# Patient Record
Sex: Female | Born: 1995
Health system: Southern US, Community
[De-identification: ages and names within clinical notes are randomized; demographics above are authoritative.]

## PROBLEM LIST (undated history)

## (undated) ENCOUNTER — Inpatient Hospital Stay: Payer: Self-pay

## (undated) DIAGNOSIS — J45909 Unspecified asthma, uncomplicated: Secondary | ICD-10-CM

## (undated) DIAGNOSIS — D649 Anemia, unspecified: Secondary | ICD-10-CM

## (undated) DIAGNOSIS — Z789 Other specified health status: Secondary | ICD-10-CM

## (undated) HISTORY — PX: CHOLECYSTECTOMY: SHX55

---
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
## 2015-01-12 NOTE — H&P (Signed)
PATIENT NAME:  Jamie [name], Jamie [name] MR#:  [id number] DATE OF BIRTH:  1996/08/19  DATE OF ADMISSION:  04/24/2014  CHIEF COMPLAINT: Right upper quadrant pain.   HISTORY OF PRESENT ILLNESS: This is [name] patient with 6 weeks of abdominal pain. She states it has been constant, has not changed, is not affected by foods. Points to the right upper quadrant as the area of maximal tenderness. She states that the reason she came in today was because she started having intractable nausea and vomiting. Thinks that she has had fevers, has not taken her temperature.  Has never had an episode prior to late June, but states that her pain has been nearly constant and never went away completely. Denies jaundice or acholic stools.   PAST MEDICAL HISTORY: None.   PAST SURGICAL HISTORY: None.   ALLERGIES: NONE.   MEDICATIONS: None.   FAMILY HISTORY: Noncontributory.   SOCIAL HISTORY: The patient is [place] in [place]. Does not smoke nor does she drink.   REVIEW OF SYSTEMS: Ten-system review is performed and negative with the exception of that mentioned in the HPI.   PHYSICAL EXAMINATION:  GENERAL: Uncomfortable -appearing thin female patient, BMI of 22.  She prefers to keep her eyes closed and, in fact, does not open her eyes at all during the entire interview or physical exam.  VITAL SIGNS: Stable. She is afebrile.  HEENT: No scleral icterus is noted when she does open her eyes for the exam.  NECK: No palpable neck nodes.  CHEST: Clear to auscultation.  CARDIAC: Regular rate and rhythm.  ABDOMEN: Soft. There is tenderness in the right upper quadrant with [name] positive Murphy's sign.  EXTREMITIES: Without edema.  NEUROLOGIC: Grossly intact.  INTEGUMENT: No jaundice.   IMAGING:  Ultrasound report is not available, but was conveyed to me as being positive Murphy's sign with [name] stone impacted in the neck of the gallbladder.   LABORATORY DATA:  Liver function tests are within normal limits as are electrolytes.    White blood cell count 17, hemoglobin and hematocrit of 12.8 and 39, platelet count 353,000.   ASSESSMENT AND PLAN: This is [name] patient with acute cholecystitis. Recommend admission to the hospital, starting IV antibiotics, hydrating, controlling pain and nausea, and proceeding with laparoscopic cholecystectomy probably tomorrow. The rationale for this has been discussed. The options of observation have been reviewed. The risks of bleeding, infection, recurrent symptoms, failure to resolve her symptoms, conversion to an open procedure, bile duct damage, bile duct leak, retained common bile duct stone, any of which could require further surgery and/or ERCP, stent, and papillotomy have all been reviewed. She understood and agreed to proceed with this plan.     ____________________________ Adah Salvageichard E. Excell Seltzerooper, MD rec:ts D: 04/24/2014 18:04:10 ET T: 04/24/2014 18:17:42 ET JOB#: [id number]  cc: Adah Salvageichard E. Excell Seltzerooper, MD, <Dictator> Lattie HawICHARD E Aviyon Hocevar MD ELECTRONICALLY SIGNED 04/24/2014 19:00

## 2015-01-12 NOTE — Op Note (Signed)
PATIENT NAME:  Jamie SleightBURCH, Sharica A MR#:  [id number] DATE OF BIRTH:  09-27-1995  DATE OF PROCEDURE:  04/25/2014  PREOPERATIVE DIAGNOSIS: Acute cholecystitis.   POSTOPERATIVE DIAGNOSIS: Acute cholecystitis.   PROCEDURE: Laparoscopic cholecystectomy.   SURGEON: Dionne Miloichard Danon Lograsso, MD.   ANESTHESIA: General with endotracheal tube.  ASSISTANT: Anselm LisStephanie Meiners, PA   INDICATIONS: This is a patient with unrelenting right upper quadrant pain associated with a workup showing gallstones and a gallstone impacted in the neck of the gallbladder. Preoperatively, we discussed rationale for surgery, the options of observation, risks of bleeding, infection, recurrence of symptoms, failure to resolve her symptoms, open procedure, bile duct damage, bile duct leak, retained common bile duct stone, any of which could require further surgery and/or ERCP, stent, and papillotomy. This was reviewed for her in the preoperative holding area. She understood and agreed to proceed.   FINDINGS: Acute edematous cholecystitis, large stone impacted in the infundibulum of the gallbladder, small cystic duct anatomy well identified.   DESCRIPTION OF PROCEDURE: The patient was induced to general anesthesia. She was on IV antibiotics. VTE prophylaxis was in place. She was prepped and draped in a sterile fashion. Marcaine was infiltrated into skin and subcutaneous tissues.   A periumbilical area incision was made. Veress needle was placed. Pneumoperitoneum was obtained. A 5 mm trocar port was placed and the abdominal cavity was explored. Under direct vision, a 10 mm epigastric port and 2 lateral 5 mm ports were placed. The gallbladder was placed on tension. The peritoneum over the infundibulum was incised bluntly. The cystic duct and gallbladder junction was well identified. The cystic artery was well identified, doubly clipped, and divided. This allowed for good visualization of the cystic duct as it entered the infundibulum of the  gallbladder. Here it was doubly clipped and divided, and then the gallbladder was taken from the gallbladder fossa with electrocautery. Two branches of a lateral arterial branch were encountered and these were doubly clipped and divided as well. One clip was dropped, and an attempt at retrieving it was not fruitful. The gallbladder was removed from the gallbladder fossa with electrocautery, and passed out through the epigastric port site with the aid of an Endo Catch bag. The area was checked for hemostasis and found to be adequate. The area was irrigated with copious amounts of normal saline. Again, hemostasis was adequate. There was no sign of bile leak or bleeding. The camera was placed in the epigastric site to view back to the periumbilical site. There was no sign of bowel injury. Therefore, pneumoperitoneum was released. All ports were removed. Fascial edges at the epigastric site were approximated with figure-of-eight 0 Vicryls; 4-0 subcuticular Monocryl was used on all skin edges. Steri-Strips, Mastisol, and sterile dressings were placed.   The patient tolerated the procedure well. There were no complications. She was taken to the recovery room in stable condition to be admitted for continued care.   ____________________________ Adah Salvageichard E. Excell Seltzerooper, MD rec:nb D: 04/25/2014 10:17:12 ET T: 04/25/2014 11:04:19 ET JOB#: [id number]  cc: Adah Salvageichard E. Excell Seltzerooper, MD, <Dictator> Lattie HawICHARD E Keidy Thurgood MD ELECTRONICALLY SIGNED 04/25/2014 16:12

## 2015-01-12 NOTE — Discharge Summary (Signed)
PATIENT NAME:  Jamie SleightBURCH, Ardell A MR#:  [id number] DATE OF BIRTH:  Mar 17, 1996  DATE OF ADMISSION:  04/24/2014 DATE OF DISCHARGE:  04/27/2014  DIAGNOSES:  1.  Cholelithiasis.  2.  Acute cholecystitis.   PROCEDURES: Laparoscopic cholecystectomy.   HISTORY OF PRESENT ILLNESS AND HOSPITAL COURSE: This is a patient with right upper quadrant pain and a workup showing acute cholecystitis. She was taken to the Operating Room where laparoscopic cholecystectomy was performed. She made an uncomplicated postoperative recovery, and is discharged in stable condition to follow up in our office in 10 days. She is given oral analgesics and is tolerating a regular diet.   Also of note, the patient had a cosmetic injury to her left eye which was brought up while she was in the hospital and is improving. She has no vision problems, per the patient, and states that it is improving. Did not request any additional therapy.   The patient will follow up in our office in 10 days.     ____________________________ Adah Salvageichard E. Excell Seltzerooper, MD rec:at D: 04/27/2014 12:36:42 ET T: 04/27/2014 13:56:38 ET JOB#: [id number]  cc: Adah Salvageichard E. Excell Seltzerooper, MD, <Dictator> Lattie HawICHARD E Harlan Vinal MD ELECTRONICALLY SIGNED 04/27/2014 15:20

## 2015-07-02 ENCOUNTER — Encounter: Payer: Self-pay | Admitting: Emergency Medicine

## 2015-07-02 ENCOUNTER — Emergency Department
Admission: EM | Admit: 2015-07-02 | Discharge: 2015-07-02 | Disposition: A | Discharge status: Home / Self Care | Payer: Medicaid Other | Attending: Emergency Medicine | Admitting: Emergency Medicine

## 2015-07-02 ENCOUNTER — Emergency Department: Payer: Medicaid Other

## 2015-07-02 DIAGNOSIS — O212 Late vomiting of pregnancy: Secondary | ICD-10-CM | POA: Diagnosis not present

## 2015-07-02 DIAGNOSIS — Z3A01 Less than 8 weeks gestation of pregnancy: Secondary | ICD-10-CM | POA: Diagnosis not present

## 2015-07-02 DIAGNOSIS — R109 Unspecified abdominal pain: Secondary | ICD-10-CM | POA: Insufficient documentation

## 2015-07-02 DIAGNOSIS — O3481 Maternal care for other abnormalities of pelvic organs, first trimester: Secondary | ICD-10-CM | POA: Diagnosis not present

## 2015-07-02 DIAGNOSIS — N83201 Unspecified ovarian cyst, right side: Secondary | ICD-10-CM | POA: Diagnosis not present

## 2015-07-02 DIAGNOSIS — O26899 Other specified pregnancy related conditions, unspecified trimester: Secondary | ICD-10-CM

## 2015-07-02 DIAGNOSIS — O9989 Other specified diseases and conditions complicating pregnancy, childbirth and the puerperium: Secondary | ICD-10-CM | POA: Diagnosis not present

## 2015-07-02 DIAGNOSIS — R112 Nausea with vomiting, unspecified: Secondary | ICD-10-CM

## 2015-07-02 LAB — WET PREP, GENITAL
CLUE CELLS WET PREP: NONE SEEN
TRICH WET PREP: NONE SEEN
Yeast Wet Prep HPF POC: NONE SEEN

## 2015-07-02 LAB — COMPREHENSIVE METABOLIC PANEL
ALT: 18 U/L (ref 14–54)
ANION GAP: 5 (ref 5–15)
AST: 18 U/L (ref 15–41)
Albumin: 4.7 g/dL (ref 3.5–5.0)
Alkaline Phosphatase: 52 U/L (ref 38–126)
BILIRUBIN TOTAL: 1.1 mg/dL (ref 0.3–1.2)
BUN: 10 mg/dL (ref 6–20)
CO2: 23 mmol/L (ref 22–32)
Calcium: 9.8 mg/dL (ref 8.9–10.3)
Chloride: 105 mmol/L (ref 101–111)
Creatinine, Ser: 0.63 mg/dL (ref 0.44–1.00)
GFR calc Af Amer: >60 mL/min (ref >60)
Glucose, Bld: 90 mg/dL (ref 65–99)
POTASSIUM: 3.6 mmol/L (ref 3.5–5.1)
Sodium: 133 mmol/L — ABNORMAL LOW (ref 135–145)
TOTAL PROTEIN: 7.6 g/dL (ref 6.5–8.1)

## 2015-07-02 LAB — CBC WITH DIFFERENTIAL/PLATELET
BASOS PCT: 1 %
Basophils Absolute: 0.1 10*3/uL (ref 0–0.1)
EOS ABS: 0 10*3/uL (ref 0–0.7)
Eosinophils Relative: 0 %
HCT: 38.3 % (ref 35.0–47.0)
HEMOGLOBIN: 13 g/dL (ref 12.0–16.0)
Lymphocytes Relative: 13 %
Lymphs Abs: 1.6 10*3/uL (ref 1.0–3.6)
MCH: 29.9 pg (ref 26.0–34.0)
MCHC: 33.9 g/dL (ref 32.0–36.0)
MCV: 88.2 fL (ref 80.0–100.0)
MONO ABS: 0.7 10*3/uL (ref 0.2–0.9)
MONOS PCT: 6 %
NEUTROS PCT: 80 %
Neutro Abs: 10 10*3/uL — ABNORMAL HIGH (ref 1.4–6.5)
Platelets: 385 10*3/uL (ref 150–440)
RBC: 4.34 MIL/uL (ref 3.80–5.20)
RDW: 12.5 % (ref 11.5–14.5)
WBC: 12.3 10*3/uL — ABNORMAL HIGH (ref 3.6–11.0)

## 2015-07-02 LAB — CHLAMYDIA/NGC RT PCR (ARMC ONLY)
CHLAMYDIA TR: NOT DETECTED
N gonorrhoeae: NOT DETECTED

## 2015-07-02 LAB — URINALYSIS COMPLETE WITH MICROSCOPIC (ARMC ONLY)
BILIRUBIN URINE: NEGATIVE
Bacteria, UA: NONE SEEN
GLUCOSE, UA: NEGATIVE mg/dL
Hgb urine dipstick: NEGATIVE
Leukocytes, UA: NEGATIVE
Nitrite: NEGATIVE
Protein, ur: NEGATIVE mg/dL
Specific Gravity, Urine: 1.025 (ref 1.005–1.030)
pH: 6 (ref 5.0–8.0)

## 2015-07-02 LAB — ABO/RH: ABO/RH(D): O POS

## 2015-07-02 LAB — HCG, QUANTITATIVE, PREGNANCY: HCG, BETA CHAIN, QUANT, S: 64893 m[IU]/mL — AB (ref <5)

## 2015-07-02 MED ORDER — METOCLOPRAMIDE HCL 5 MG/ML IJ SOLN
10.0000 mg | Freq: Once | INTRAMUSCULAR | Status: AC
  Administered 2015-07-02: 10 mg via INTRAVENOUS
  Filled 2015-07-02 (×2): qty 2

## 2015-07-02 MED ORDER — METOCLOPRAMIDE HCL 10 MG PO TABS: 10.0000 mg | ORAL_TABLET | Freq: Four times a day (QID) | ORAL | Status: DC | PRN

## 2015-07-02 MED ORDER — VITAMIN B-6 50 MG PO TABS: 75.0000 mg | ORAL_TABLET | Freq: Every day | ORAL | Status: DC

## 2015-07-02 MED ORDER — FAMOTIDINE IN NACL 20-0.9 MG/50ML-% IV SOLN
20.0000 mg | Freq: Once | INTRAVENOUS | Status: AC
  Administered 2015-07-02: 20 mg via INTRAVENOUS
  Filled 2015-07-02: qty 50

## 2015-07-02 MED ORDER — SODIUM CHLORIDE 0.9 % IV BOLUS (SEPSIS)
1000.0000 mL | Freq: Once | INTRAVENOUS | Status: AC
  Administered 2015-07-02: 1000 mL via INTRAVENOUS

## 2015-07-02 NOTE — ED Provider Notes (Signed)
[facility] Emergency Department Provider Note  ____________________________________________  Time seen: Approximately 2:45 PM  I have reviewed the triage vital signs and the nursing notes.   HISTORY  Chief Complaint Emesis During Pregnancy    HPI Jamie [name] is a 19 y.o. female who is a G1 presenting at about 5-6 weeks of pregnancy who is here with vomiting over the past 2 days. She says she is also had some intermittent cramping to the left upper quadrant of her abdomen. She says she does have history of GERD. Denies any vaginal bleeding or discharge at this time. Does have a history of gonorrhea and Chlamydia. Was recently diagnosed with gonorrhea in early October and treated. She has only been seen at the health department thus far for her pregnancy. She is taking prenatal vitamins at home. Denies any abdominal pain at this time. No pain with urination. Says she has vomited everything over the past 2 days. She says she has not even been able to keep down fluids. Denies any blood or green/bilious vomiting. Has not taken any medication for nausea.   History reviewed. No pertinent past medical history.  There are no active problems to display for this patient.   History reviewed. No pertinent past surgical history.  No current outpatient prescriptions on file.  Allergies Review of patient's allergies indicates no known allergies.  No family history on file.  Social History Social History  Substance Use Topics  . Smoking status: Never Smoker   . Smokeless tobacco: None  . Alcohol Use: No    Review of Systems Constitutional: No fever/chills Eyes: No visual changes. ENT: No sore throat. Cardiovascular: Denies chest pain. Respiratory: Denies shortness of breath. Gastrointestinal:  No diarrhea.  No constipation. Genitourinary: Negative for dysuria. Musculoskeletal: Negative for back pain. Skin: Negative for rash. Neurological: Negative for  headaches, focal weakness or numbness.  10-point ROS otherwise negative.  ____________________________________________   PHYSICAL EXAM:  VITAL SIGNS: ED Triage Vitals  Enc Vitals Group     BP 07/02/15 1425 97/50 mmHg     Pulse Rate 07/02/15 1425 95     Resp 07/02/15 1425 20     Temp 07/02/15 1425 97.6 F (36.4 C)     Temp Source 07/02/15 1425 Oral     SpO2 07/02/15 1425 98 %     Weight 07/02/15 1425 114 lb (51.71 kg)     Height 07/02/15 1425  (1.499 m)     Head Cir --      Peak Flow --      Pain Score --      Pain Loc --      Pain Edu? --      Excl. in GC? --     Constitutional: Alert and oriented. Well appearing and in no acute distress. Eyes: Conjunctivae are normal. PERRL. EOMI. Head: Atraumatic. Nose: No congestion/rhinnorhea. Mouth/Throat: Mucous membranes are moist.   Neck: No stridor.   Cardiovascular: Normal rate, regular rhythm. Grossly normal heart sounds.  Good peripheral circulation. Respiratory: Normal respiratory effort.  No retractions. Lungs CTAB. Gastrointestinal: Soft with mild tenderness in the left upper quadrant as well as suprapubic and right lower quadrant. There is no rebound or guarding.. No distention. No abdominal bruits. No CVA tenderness. Genitourinary:  Normal external appearance. Speculum exam without any bleeding or discharge. Bimanual exam with closed cervix. No CMT. No uterine or adnexal masses. No uterine or adnexal tenderness. Musculoskeletal: No lower extremity tenderness nor edema.  No joint effusions.  Neurologic:  Normal speech and language. No gross focal neurologic deficits are appreciated. No gait instability. Skin:  Skin is warm, dry and intact. No rash noted. Psychiatric: Mood and affect are normal. Speech and behavior are normal.  ____________________________________________   LABS (all labs ordered are listed, but only abnormal results are displayed)  Labs Reviewed  WET PREP, GENITAL - Abnormal; Notable for the  following:    WBC, Wet Prep HPF POC FEW (*)    All other components within normal limits  CBC WITH DIFFERENTIAL/PLATELET - Abnormal; Notable for the following:    WBC 12.3 (*)    Neutro Abs 10.0 (*)    All other components within normal limits  HCG, QUANTITATIVE, PREGNANCY - Abnormal; Notable for the following:    hCG, Beta Chain, Quant, [place] [address] (*)    All other components within normal limits  URINALYSIS COMPLETEWITH MICROSCOPIC ([facility] ONLY) - Abnormal; Notable for the following:    Color, Urine YELLOW (*)    APPearance CLEAR (*)    Ketones, ur TRACE (*)    Squamous Epithelial / LPF 0-5 (*)    All other components within normal limits  COMPREHENSIVE METABOLIC PANEL - Abnormal; Notable for the following:    Sodium 133 (*)    All other components within normal limits  CHLAMYDIA/NGC RT PCR ([facility] ONLY)  ABO/RH   ____________________________________________  EKG   ____________________________________________  RADIOLOGY  Ultrasound with single IUP with estimated age of 6 weeks and 1 day. A small subchorionic hemorrhage. Also with 1.9 x 2.9 x 2.3 simple appearing right ovarian cyst. Heart rate of 114. ____________________________________________   PROCEDURES   ____________________________________________   INITIAL IMPRESSION / ASSESSMENT AND PLAN / ED COURSE  Pertinent labs & imaging results that were available during my care of the patient were reviewed by me and considered in my medical decision making (see chart for details).  ----------------------------------------- 5:41 PM on 07/02/2015 -----------------------------------------  Patient has not vomited throughout her visit after Reglan. Also says pain is completely gone. I reexamined her abdomen which is soft and completely nontender. I focused specifically on the right lower quadrant and periumbilical area with there was no pain to both moderate deep palpation. She says the pain has been migrating  throughout her abdomen. We discussed her elevated white count as well as very detailed return precautions. I counseled the patient extensively about right lower quadrant pain being a possible sign of appendicitis. She knows to return immediately for any further pain or nausea or vomiting. Although the patient does have an elevated white count at this time. Her abdomen has been soft without any guarding throughout her stay. The pain is migrating throughout the abdomen and is not staying localized throughout the past 1-2 days. Additionally, she has a right-sided ovarian cyst which could be contributing to the pain. Furthermore, the patient is now saying that since she works at [employer] there are some strong smells at her job which is been bothering her since she has been pregnant and causing her nausea and vomiting. She will continue to take her prenatal vitamins. I'll prescribe her vitamin B6 for further nausea control as well as Reglan for breakthrough nausea or vomiting. She plans to follow-up with the [facility] clinic. She understands the plan and is willing to comply. ____________________________________________   FINAL CLINICAL IMPRESSION(S) / ED DIAGNOSES  Acute abdominal pain with nausea and vomiting in pregnancy.    Myrna Blazer, MD 07/02/15 [phone]

## 2015-07-02 NOTE — ED Notes (Signed)
States she is about 5 weeks preg and has had vomiting since yesterday .Marland Kitchen Denies any vaginal cramping or bleeding

## 2015-07-02 NOTE — ED Notes (Signed)
Pt with nausea, some emesis. Denies cough, fever. Lungs clear bilaterally, + bowel sounds. States 5-7 weeks preg. First preg.

## 2015-07-08 ENCOUNTER — Encounter: Payer: Self-pay | Admitting: Emergency Medicine

## 2015-07-08 ENCOUNTER — Emergency Department
Admission: EM | Admit: 2015-07-08 | Discharge: 2015-07-08 | Discharge status: Against Medical Advice | Payer: Medicaid Other | Attending: Emergency Medicine | Admitting: Emergency Medicine

## 2015-07-08 DIAGNOSIS — O210 Mild hyperemesis gravidarum: Secondary | ICD-10-CM | POA: Diagnosis not present

## 2015-07-08 DIAGNOSIS — O9989 Other specified diseases and conditions complicating pregnancy, childbirth and the puerperium: Secondary | ICD-10-CM | POA: Insufficient documentation

## 2015-07-08 DIAGNOSIS — Z3A01 Less than 8 weeks gestation of pregnancy: Secondary | ICD-10-CM | POA: Diagnosis not present

## 2015-07-08 DIAGNOSIS — R1030 Lower abdominal pain, unspecified: Secondary | ICD-10-CM | POA: Insufficient documentation

## 2015-07-08 LAB — COMPREHENSIVE METABOLIC PANEL
ALBUMIN: 3.9 g/dL (ref 3.5–5.0)
ALK PHOS: 48 U/L (ref 38–126)
ALT: 14 U/L (ref 14–54)
AST: 17 U/L (ref 15–41)
Anion gap: 5 (ref 5–15)
BILIRUBIN TOTAL: 0.3 mg/dL (ref 0.3–1.2)
BUN: 7 mg/dL (ref 6–20)
CALCIUM: 8.8 mg/dL — AB (ref 8.9–10.3)
CO2: 28 mmol/L (ref 22–32)
Chloride: 107 mmol/L (ref 101–111)
Creatinine, Ser: 0.53 mg/dL (ref 0.44–1.00)
GFR calc Af Amer: >60 mL/min (ref >60)
GFR calc non Af Amer: >60 mL/min (ref >60)
GLUCOSE: 100 mg/dL — AB (ref 65–99)
Potassium: 3.5 mmol/L (ref 3.5–5.1)
Sodium: 140 mmol/L (ref 135–145)
TOTAL PROTEIN: 6.8 g/dL (ref 6.5–8.1)

## 2015-07-08 LAB — CBC WITH DIFFERENTIAL/PLATELET
BASOS ABS: 0.2 10*3/uL — AB (ref 0–0.1)
BASOS PCT: 1 %
EOS ABS: 0.1 10*3/uL (ref 0–0.7)
EOS PCT: 1 %
HCT: 37.2 % (ref 35.0–47.0)
Hemoglobin: 12.5 g/dL (ref 12.0–16.0)
Lymphocytes Relative: 20 %
Lymphs Abs: 2.9 10*3/uL (ref 1.0–3.6)
MCH: 30.2 pg (ref 26.0–34.0)
MCHC: 33.6 g/dL (ref 32.0–36.0)
MCV: 90 fL (ref 80.0–100.0)
MONO ABS: 0.6 10*3/uL (ref 0.2–0.9)
Monocytes Relative: 4 %
Neutro Abs: 10.5 10*3/uL — ABNORMAL HIGH (ref 1.4–6.5)
Neutrophils Relative %: 74 %
PLATELETS: 368 10*3/uL (ref 150–440)
RBC: 4.13 MIL/uL (ref 3.80–5.20)
RDW: 12.6 % (ref 11.5–14.5)
WBC: 14.3 10*3/uL — ABNORMAL HIGH (ref 3.6–11.0)

## 2015-07-08 LAB — URINALYSIS COMPLETE WITH MICROSCOPIC (ARMC ONLY)
BILIRUBIN URINE: NEGATIVE
Bacteria, UA: NONE SEEN
GLUCOSE, UA: NEGATIVE mg/dL
HGB URINE DIPSTICK: NEGATIVE
Ketones, ur: NEGATIVE mg/dL
LEUKOCYTES UA: NEGATIVE
NITRITE: NEGATIVE
Protein, ur: NEGATIVE mg/dL
SPECIFIC GRAVITY, URINE: 1.021 (ref 1.005–1.030)
pH: 6 (ref 5.0–8.0)

## 2015-07-08 LAB — HCG, QUANTITATIVE, PREGNANCY: hCG, Beta Chain, Quant, S: 190965 m[IU]/mL — ABNORMAL HIGH (ref <5)

## 2015-07-08 NOTE — ED Notes (Addendum)
Pt presents to ED with vomiting since 400 this morning with continued nausea and vomiting (X9) since then. Pt now c/o right lower abd pain since around 1800. Movement and touch do no increase pain. Pt currently [redacted] weeks pregnant and was told at last visit that she had a cyst on her right ovary after ultrasound was performed. Imaging was otherwise normal at that time. Denies vaginal bleeding.

## 2015-07-09 ENCOUNTER — Emergency Department
Admission: EM | Admit: 2015-07-09 | Discharge: 2015-07-09 | Discharge status: Against Medical Advice | Payer: Medicaid Other | Attending: Emergency Medicine | Admitting: Emergency Medicine

## 2015-07-09 ENCOUNTER — Telehealth: Payer: Self-pay | Admitting: Emergency Medicine

## 2015-07-09 ENCOUNTER — Encounter: Payer: Self-pay | Admitting: *Deleted

## 2015-07-09 DIAGNOSIS — Z3A01 Less than 8 weeks gestation of pregnancy: Secondary | ICD-10-CM | POA: Insufficient documentation

## 2015-07-09 DIAGNOSIS — Z79899 Other long term (current) drug therapy: Secondary | ICD-10-CM | POA: Insufficient documentation

## 2015-07-09 DIAGNOSIS — O210 Mild hyperemesis gravidarum: Secondary | ICD-10-CM | POA: Diagnosis not present

## 2015-07-09 DIAGNOSIS — R112 Nausea with vomiting, unspecified: Secondary | ICD-10-CM

## 2015-07-09 DIAGNOSIS — R102 Pelvic and perineal pain: Secondary | ICD-10-CM | POA: Diagnosis not present

## 2015-07-09 DIAGNOSIS — O9989 Other specified diseases and conditions complicating pregnancy, childbirth and the puerperium: Secondary | ICD-10-CM | POA: Insufficient documentation

## 2015-07-09 LAB — BASIC METABOLIC PANEL
ANION GAP: 6 (ref 5–15)
BUN: 6 mg/dL (ref 6–20)
CALCIUM: 9.1 mg/dL (ref 8.9–10.3)
CO2: 25 mmol/L (ref 22–32)
Chloride: 108 mmol/L (ref 101–111)
Creatinine, Ser: 0.47 mg/dL (ref 0.44–1.00)
GFR calc Af Amer: >60 mL/min (ref >60)
GFR calc non Af Amer: >60 mL/min (ref >60)
GLUCOSE: 86 mg/dL (ref 65–99)
POTASSIUM: 3.7 mmol/L (ref 3.5–5.1)
Sodium: 139 mmol/L (ref 135–145)

## 2015-07-09 LAB — URINALYSIS COMPLETE WITH MICROSCOPIC (ARMC ONLY)
BILIRUBIN URINE: NEGATIVE
Bacteria, UA: NONE SEEN
Glucose, UA: NEGATIVE mg/dL
Hgb urine dipstick: NEGATIVE
LEUKOCYTES UA: NEGATIVE
NITRITE: NEGATIVE
PH: 7 (ref 5.0–8.0)
Protein, ur: NEGATIVE mg/dL
SPECIFIC GRAVITY, URINE: 1.009 (ref 1.005–1.030)

## 2015-07-09 MED ORDER — SODIUM CHLORIDE 0.9 % IV BOLUS (SEPSIS)
1000.0000 mL | Freq: Once | INTRAVENOUS | Status: AC
  Administered 2015-07-09: 1000 mL via INTRAVENOUS

## 2015-07-09 MED ORDER — METOCLOPRAMIDE HCL 5 MG/ML IJ SOLN
10.0000 mg | Freq: Once | INTRAMUSCULAR | Status: AC
  Administered 2015-07-09: 10 mg via INTRAVENOUS
  Filled 2015-07-09 (×2): qty 2

## 2015-07-09 NOTE — ED Notes (Signed)
Pt states she has to leave, related ride.

## 2015-07-09 NOTE — ED Notes (Addendum)
Pt reports vomiting since yesterday morning, has vomited multiple times today, c/o headache. Denies vaginal d/c, bleeding, or urinary symptoms. Pt states she is approx [redacted] weeks pregnant. Pt was here last night and LWBS prior to evaluation.

## 2015-07-09 NOTE — ED Provider Notes (Signed)
[facility] Emergency Department Provider Note   ____________________________________________  Time seen: 2000  I have reviewed the triage vital signs and the nursing notes.   HISTORY  Chief Complaint Emesis During Pregnancy and Abdominal Pain   History limited by: Not Limited   HPI Jamie [name] is a 19 y.o. female at roughly [redacted] weeks pregnant who presents to the emergency department today because of concerns for nausea and vomiting. She has been seen previously in the emergency department for the same symptoms. She states that her nausea and vomiting has continued. It is worse in the morning time. She has only sporadically taken the anti-medic that was prescribed to her at her previous emergency department visit. She denies taking in the past couple of days. In addition the patient states she has had some continued right pelvic pain.  History reviewed. No pertinent past medical history.  There are no active problems to display for this patient.   Past Surgical History  Procedure Laterality Date  . Cholecystectomy      Current Outpatient Rx  Name  Route  Sig  Dispense  Refill  . metoCLOPramide (REGLAN) 10 MG tablet   Oral   Take 1 tablet (10 mg total) by mouth every 6 (six) hours as needed (breakthrough nausea/vomiting).   12 tablet   0   . pyridOXINE (VITAMIN B-6) 50 MG tablet   Oral   Take 1.5 tablets (75 mg total) by mouth daily.   45 tablet   0     Allergies Review of patient's allergies indicates no known allergies.  No family history on file.  Social History Social History  Substance Use Topics  . Smoking status: Never Smoker   . Smokeless tobacco: None  . Alcohol Use: No    Review of Systems  Constitutional: Negative for fever. Cardiovascular: Negative for chest pain. Respiratory: Negative for shortness of breath. Gastrointestinal: Right pelvic pain, nausea and vomiting. Genitourinary: Negative for  dysuria. Musculoskeletal: Negative for back pain. Skin: Negative for rash. Neurological: Negative for headaches, focal weakness or numbness.   10-point ROS otherwise negative.  ____________________________________________   PHYSICAL EXAM:  VITAL SIGNS: ED Triage Vitals  Enc Vitals Group     BP 07/09/15 1850 122/63 mmHg     Pulse Rate 07/09/15 1850 95     Resp 07/09/15 1850 16     Temp 07/09/15 1850 99.6 F (37.6 C)     Temp Source 07/09/15 1850 Oral     SpO2 07/09/15 1850 95 %     Weight 07/09/15 1850 115 lb (52.164 kg)     Height 07/09/15 1850 4\' 11"  (1.499 m)     Head Cir --      Peak Flow --      Pain Score 07/09/15 1851 9   Constitutional: Alert and oriented. Well appearing and in no distress. Eyes: Conjunctivae are normal. PERRL. Normal extraocular movements. ENT   Head: Normocephalic and atraumatic.   Nose: No congestion/rhinnorhea.   Mouth/Throat: Mucous membranes are moist.   Neck: No stridor. Hematological/Lymphatic/Immunilogical: No cervical lymphadenopathy. Cardiovascular: Normal rate, regular rhythm.  No murmurs, rubs, or gallops. Respiratory: Normal respiratory effort without tachypnea nor retractions. Breath sounds are clear and equal bilaterally. No wheezes/rales/rhonchi. Gastrointestinal: Soft and nontender. No distention.  Genitourinary: Deferred Musculoskeletal: Normal range of motion in all extremities. No joint effusions.  No lower extremity tenderness nor edema. Neurologic:  Normal speech and language. No gross focal neurologic deficits are appreciated. Speech is normal.  Skin:  Skin is warm, dry and intact. No rash noted. Psychiatric: Mood and affect are normal. Speech and behavior are normal. Patient exhibits appropriate insight and judgment.  ____________________________________________    LABS (pertinent positives/negatives)  Labs Reviewed  URINALYSIS COMPLETEWITH MICROSCOPIC ([facility] ONLY) - Abnormal; Notable for the following:     Color, Urine YELLOW (*)    APPearance CLEAR (*)    Ketones, ur TRACE (*)    Squamous Epithelial / LPF 0-5 (*)    All other components within normal limits  BASIC METABOLIC PANEL     ____________________________________________   EKG  None  ____________________________________________    RADIOLOGY  None   ____________________________________________   PROCEDURES  Procedure(s) performed: None  Critical Care performed: No  ____________________________________________   INITIAL IMPRESSION / ASSESSMENT AND PLAN / ED COURSE  Pertinent labs & imaging results that were available during my care of the patient were reviewed by me and considered in my medical decision making (see chart for details).   Patient presents to the emergency department with continued nausea and vomiting in the setting of pregnancy. On exam patient in no acute distress. Will plan on getting blood work and given IV fluids.  Patient left the emergency department prior to completion of fluids. Blood work did not show any concerning findings. ____________________________________________   FINAL CLINICAL IMPRESSION(S) / ED DIAGNOSES  Final diagnoses:  Non-intractable vomiting with nausea, vomiting of unspecified type     Phineas Semen, MD 07/09/15 2118

## 2015-07-09 NOTE — ED Notes (Addendum)
Pt signed ama states can not wait related to ride. Alert x4 ambulates without distress.

## 2015-07-09 NOTE — ED Notes (Signed)
Called patient due to lwot to inquire about condition and follow up plans. Left message with my number. 

## 2015-09-19 LAB — OB RESULTS CONSOLE RUBELLA ANTIBODY, IGM: RUBELLA: IMMUNE

## 2015-09-19 LAB — OB RESULTS CONSOLE HEPATITIS B SURFACE ANTIGEN: Hepatitis B Surface Ag: NEGATIVE

## 2015-09-19 LAB — OB RESULTS CONSOLE VARICELLA ZOSTER ANTIBODY, IGG: Varicella: IMMUNE

## 2015-09-22 NOTE — L&D Delivery Note (Signed)
Delivery Note At 10:54 PM a viable female was delivered via C-Section, Low Transverse (Presentation: cephalic).  APGAR: 8, 9; weight 6 lb 5.9 oz (2890 g).   Placenta status: Intact, .  Cord: 3 vessels with the following complications: none apparent.  Cord pH: n/a  Anesthesia: Spinal  Episiotomy: n/a  Lacerations:  n/a  Est. Blood Loss (mL):  400cc  Mom to postpartum.  Baby to Couplet care / Skin to Skin.  See op note for details of surgery.  Mom presented for IOL due to past due dates at 40.5, received cervidil, pitocin and AROM.  She held at 4cm for some time and began to have stronger contractions.  She then refused further intervention of epidural or pitocin after pit rest and requested cesarean delivery.  It was explained to her tirelessly that she was not yet in labor and perhaps more time and intervention would allow for labor and a vaginal delivery.  She refused.  Cesarean delivery was uncomplicated, and we sang happy birthday to baby Jamie [name].  Chelsea C Ward 03/02/2016, 12:10 AM

## 2015-11-18 ENCOUNTER — Observation Stay
Admission: EM | Admit: 2015-11-18 | Discharge: 2015-11-18 | Disposition: A | Discharge status: Home / Self Care | Payer: Medicaid Other | Attending: Obstetrics and Gynecology | Admitting: Obstetrics and Gynecology

## 2015-11-18 ENCOUNTER — Encounter: Payer: Self-pay | Admitting: *Deleted

## 2015-11-18 DIAGNOSIS — O368190 Decreased fetal movements, unspecified trimester, not applicable or unspecified: Secondary | ICD-10-CM | POA: Diagnosis present

## 2015-11-18 DIAGNOSIS — Z3A26 26 weeks gestation of pregnancy: Secondary | ICD-10-CM | POA: Insufficient documentation

## 2015-11-18 DIAGNOSIS — O368120 Decreased fetal movements, second trimester, not applicable or unspecified: Secondary | ICD-10-CM | POA: Diagnosis not present

## 2015-11-18 LAB — URINALYSIS COMPLETE WITH MICROSCOPIC (ARMC ONLY)
Bilirubin Urine: NEGATIVE
GLUCOSE, UA: NEGATIVE mg/dL
HGB URINE DIPSTICK: NEGATIVE
Ketones, ur: NEGATIVE mg/dL
Nitrite: NEGATIVE
PH: 7 (ref 5.0–8.0)
PROTEIN: NEGATIVE mg/dL
Specific Gravity, Urine: 1.014 (ref 1.005–1.030)

## 2015-11-18 NOTE — OB Triage Note (Signed)
Patient arrived to [facility] with complain of decreased fetal movement since this past Friday evening after working 9  Hours. 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 She states that after work she noticed that the baby movements were decreased and she had tenderness in her abd. In the fundal area. She rates this 3 on upper fundual area to 7 lower abd.  Pt denies bleeding, leaking fluid. She states the last time she felt movement was last night and is marking multiple movements this morning while here.  Ellison Carwin RNC

## 2015-11-18 NOTE — Discharge Summary (Signed)
Obstetric History and Physical  Jamie [name] is a 20 y.o. G1P0 with Estimated Date of Delivery: 02/23/16 per LMP and 6 wk [place] who presents at [redacted]w[redacted]d   presenting for decreased fetal movement since 2/24. Prior to this she noted frequent fetal movement since approx 13 weeks.  Patient states she has been having no contractions, no vaginal bleeding, intact membranes. Reports some upper abdominal/rib discomfort.     Prenatal Course Source of Care: [facility]  with onset of care at 9 weeks Pregnancy complications or risks: Patient Active Problem List   Diagnosis Date Noted  . Decreased fetal movement 11/18/2015     Prenatal Transfer Tool   No past medical history on file.  Past Surgical History  Procedure Laterality Date  . Cholecystectomy      OB History  Gravida Para Term Preterm AB SAB TAB Ectopic Multiple Living  1             # Outcome Date GA Lbr Len/2nd Weight Sex Delivery Anes PTL Lv  1 Current               Social History   Social History  . Marital Status: Single    Spouse Name: N/A  . Number of Children: N/A  . Years of Education: N/A   Social History Main Topics  . Smoking status: Never Smoker   . Smokeless tobacco: Not on file  . Alcohol Use: No  . Drug Use: No  . Sexual Activity: Not on file   Other Topics Concern  . Not on file   Social History Narrative    No family history on file.  Meds: Prenatal Vitamins  No Known Allergies  Review of Systems: Negative except for what is mentioned in HPI.  Physical Exam: BP 111/58 mmHg  Pulse 76  Temp(Src) 98.1 F (36.7 C) (Oral)  Ht  (1.499 m)  Wt 129 lb (58.514 kg)  BMI 26.04 kg/m2  LMP 05/19/2015 GENERAL: Well-developed, well-nourished female in no acute distress.  ABDOMEN: Soft, nontender, nondistended, gravid. EXTREMITIES: Nontender, no edema Cervical Exam:deferred FHT: Category: 1 for gestational age Baseline rate 135 bpm  With moderate variability Contractions: none  Pertinent  Labs/Studies:   Results for orders placed or performed during the hospital encounter of 11/18/15 (from the past 24 hour(s))  Urinalysis complete, with microscopic ([facility] only)     Status: Abnormal   Collection Time: 11/18/15  8:58 AM  Result Value Ref Range   Color, Urine YELLOW (A) YELLOW   APPearance HAZY (A) CLEAR   Glucose, UA NEGATIVE NEGATIVE mg/dL   Bilirubin Urine NEGATIVE NEGATIVE   Ketones, ur NEGATIVE NEGATIVE mg/dL   Specific Gravity, Urine 1.014 1.005 - 1.030   Hgb urine dipstick NEGATIVE NEGATIVE   pH 7.0 5.0 - 8.0   Protein, ur NEGATIVE NEGATIVE mg/dL   Nitrite NEGATIVE NEGATIVE   Leukocytes, UA 2+ (A) NEGATIVE   RBC / HPF 0-5 0 - 5 RBC/hpf   WBC, UA 0-5 0 - 5 WBC/hpf   Bacteria, UA FEW (A) NONE SEEN   Squamous Epithelial / LPF 6-30 (A) NONE SEEN   Mucous PRESENT     Assessment : IUP at [redacted]w[redacted]d, decreased fetal movement  Plan: Urine Culture d/t Leukocytes on UA- will f/u prn  Decreased FM - reviewed normal movement at this gestational age. Pt reassured by reassuring FHR.   Discharge home - f/u as scheduled on 3/9

## 2015-11-20 LAB — URINE CULTURE: Special Requests: NORMAL

## 2015-11-28 LAB — OB RESULTS CONSOLE HIV ANTIBODY (ROUTINE TESTING): HIV: NONREACTIVE

## 2015-11-28 LAB — OB RESULTS CONSOLE RPR: RPR: NONREACTIVE

## 2015-12-07 ENCOUNTER — Encounter: Payer: Self-pay | Admitting: *Deleted

## 2015-12-07 ENCOUNTER — Observation Stay
Admission: EM | Admit: 2015-12-07 | Discharge: 2015-12-07 | Disposition: A | Discharge status: Home / Self Care | Payer: Medicaid Other | Attending: Obstetrics & Gynecology | Admitting: Obstetrics & Gynecology

## 2015-12-07 DIAGNOSIS — Y998 Other external cause status: Secondary | ICD-10-CM | POA: Insufficient documentation

## 2015-12-07 DIAGNOSIS — O9989 Other specified diseases and conditions complicating pregnancy, childbirth and the puerperium: Principal | ICD-10-CM | POA: Insufficient documentation

## 2015-12-07 DIAGNOSIS — Y92410 Unspecified street and highway as the place of occurrence of the external cause: Secondary | ICD-10-CM | POA: Insufficient documentation

## 2015-12-07 DIAGNOSIS — Z3A30 30 weeks gestation of pregnancy: Secondary | ICD-10-CM | POA: Diagnosis not present

## 2015-12-07 DIAGNOSIS — Y9389 Activity, other specified: Secondary | ICD-10-CM | POA: Insufficient documentation

## 2015-12-07 MED ORDER — ACETAMINOPHEN 325 MG PO TABS: 650.0000 mg | ORAL_TABLET | ORAL | Status: DC | PRN

## 2015-12-07 MED ORDER — ONDANSETRON HCL 4 MG/2ML IJ SOLN: 4.0000 mg | Freq: Four times a day (QID) | INTRAMUSCULAR | Status: DC | PRN
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
## 2015-12-07 NOTE — Discharge Instructions (Signed)
Call your provider for any questions or concerns
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
## 2016-01-24 LAB — OB RESULTS CONSOLE GBS: GBS: POSITIVE

## 2016-02-17 ENCOUNTER — Observation Stay
Admission: EM | Admit: 2016-02-17 | Discharge: 2016-02-17 | Disposition: A | Discharge status: Home / Self Care | Payer: Medicaid Other | Attending: Obstetrics & Gynecology | Admitting: Obstetrics & Gynecology

## 2016-02-17 DIAGNOSIS — Z3A39 39 weeks gestation of pregnancy: Secondary | ICD-10-CM | POA: Insufficient documentation

## 2016-02-17 DIAGNOSIS — O471 False labor at or after 37 completed weeks of gestation: Principal | ICD-10-CM | POA: Insufficient documentation

## 2016-02-17 HISTORY — DX: Other specified health status: Z78.9

## 2016-02-17 NOTE — OB Triage Note (Signed)
Jamie [name], G1P0 came in with increase contractions today at 6139 and 1 weeks. Patient states "I am having contractions about every 17 minutes. I am having pain with my contractions and making it hard to breath". Patient denies vaginal discharge and reports positive FM.

## 2016-02-18 NOTE — Discharge Summary (Signed)
Patient presented for evaluation of labor.  Patient had cervical exam by RN and this was reported to me. I reviewed her vital signs and fetal tracing, both of which were reassuring.  Patient was discharged as she was not laboring.  NST interpretation: Reactive.  Sajad Glander, MD Attending Obstetrician and Gynecologist [facility]   

## 2016-02-28 ENCOUNTER — Inpatient Hospital Stay
Admit: 2016-02-28 | Discharge: 2016-03-04 | DRG: 765 | Disposition: A | Discharge status: Home / Self Care | Payer: Medicaid Other | Attending: Certified Nurse Midwife | Admitting: Certified Nurse Midwife

## 2016-02-28 DIAGNOSIS — Z3A41 41 weeks gestation of pregnancy: Secondary | ICD-10-CM | POA: Diagnosis not present

## 2016-02-28 DIAGNOSIS — O480 Post-term pregnancy: Secondary | ICD-10-CM | POA: Diagnosis present

## 2016-02-28 DIAGNOSIS — O9902 Anemia complicating childbirth: Secondary | ICD-10-CM | POA: Diagnosis present

## 2016-02-28 DIAGNOSIS — Z22330 Carrier of Group B streptococcus: Secondary | ICD-10-CM

## 2016-02-28 DIAGNOSIS — O365930 Maternal care for other known or suspected poor fetal growth, third trimester, not applicable or unspecified: Secondary | ICD-10-CM | POA: Diagnosis present

## 2016-02-28 DIAGNOSIS — O864 Pyrexia of unknown origin following delivery: Secondary | ICD-10-CM | POA: Diagnosis present

## 2016-02-28 DIAGNOSIS — D62 Acute posthemorrhagic anemia: Secondary | ICD-10-CM | POA: Diagnosis present

## 2016-02-28 LAB — CBC
HCT: 33.1 % — ABNORMAL LOW (ref 35.0–47.0)
Hemoglobin: 11.2 g/dL — ABNORMAL LOW (ref 12.0–16.0)
MCH: 28.2 pg (ref 26.0–34.0)
MCHC: 33.8 g/dL (ref 32.0–36.0)
MCV: 83.3 fL (ref 80.0–100.0)
PLATELETS: 338 10*3/uL (ref 150–440)
RBC: 3.97 MIL/uL (ref 3.80–5.20)
RDW: 14.2 % (ref 11.5–14.5)
WBC: 12.1 10*3/uL — AB (ref 3.6–11.0)

## 2016-02-28 LAB — TYPE AND SCREEN
ABO/RH(D): O POS
Antibody Screen: NEGATIVE

## 2016-02-28 MED ORDER — OXYTOCIN 40 UNITS IN LACTATED RINGERS INFUSION - SIMPLE MED
2.5000 [IU]/h | INTRAVENOUS | Status: DC
  Administered 2016-03-01: 500 mL via INTRAVENOUS

## 2016-02-28 MED ORDER — OXYTOCIN BOLUS FROM INFUSION: 500.0000 mL | INTRAVENOUS | Status: DC

## 2016-02-28 MED ORDER — ACETAMINOPHEN 325 MG PO TABS: 650.0000 mg | ORAL_TABLET | ORAL | Status: DC | PRN

## 2016-02-28 MED ORDER — ZOLPIDEM TARTRATE 5 MG PO TABS: 5.0000 mg | ORAL_TABLET | Freq: Every evening | ORAL | Status: DC | PRN

## 2016-02-28 MED ORDER — DINOPROSTONE 10 MG VA INST
10.0000 mg | VAGINAL_INSERT | Freq: Once | VAGINAL | Status: AC
  Administered 2016-02-28: 10 mg via VAGINAL
  Filled 2016-02-28: qty 1

## 2016-02-28 MED ORDER — LACTATED RINGERS IV SOLN
INTRAVENOUS | Status: DC
  Administered 2016-02-28: 21:00:00 via INTRAVENOUS
  Administered 2016-02-29: 125 mL/h via INTRAVENOUS
  Administered 2016-03-01: 07:00:00 via INTRAVENOUS

## 2016-02-28 MED ORDER — AMPICILLIN SODIUM 2 G IJ SOLR
2.0000 g | Freq: Once | INTRAMUSCULAR | Status: AC
  Administered 2016-02-29: 2 g via INTRAVENOUS
  Filled 2016-02-28: qty 2000

## 2016-02-28 MED ORDER — LACTATED RINGERS IV SOLN: 500.0000 mL | INTRAVENOUS | Status: DC | PRN

## 2016-02-28 MED ORDER — BUTORPHANOL TARTRATE 1 MG/ML IJ SOLN
2.0000 mg | INTRAMUSCULAR | Status: DC | PRN
  Filled 2016-02-28: qty 1

## 2016-02-28 MED ORDER — TERBUTALINE SULFATE 1 MG/ML IJ SOLN: 0.2500 mg | Freq: Once | INTRAMUSCULAR | Status: DC | PRN

## 2016-02-28 NOTE — H&P (Signed)
See paper H&P  Pt admitted for IOL for postdates. Cervix 1 cm, plan for Cervidil overnight.  Pt denies any questions.

## 2016-02-29 LAB — CHLAMYDIA/NGC RT PCR (ARMC ONLY)
CHLAMYDIA TR: NOT DETECTED
N GONORRHOEAE: NOT DETECTED

## 2016-02-29 MED ORDER — TERBUTALINE SULFATE 1 MG/ML IJ SOLN: 0.2500 mg | Freq: Once | INTRAMUSCULAR | Status: DC | PRN

## 2016-02-29 MED ORDER — OXYTOCIN 40 UNITS IN LACTATED RINGERS INFUSION - SIMPLE MED
1.0000 m[IU]/min | INTRAVENOUS | Status: DC
  Administered 2016-02-29: 1 m[IU]/min via INTRAVENOUS
  Administered 2016-03-01: 2 m[IU]/min via INTRAVENOUS
  Filled 2016-02-29: qty 1000

## 2016-02-29 MED ORDER — SODIUM CHLORIDE 0.9 % IV SOLN
1.0000 g | INTRAVENOUS | Status: DC
  Administered 2016-02-29 – 2016-03-02 (×10): 1 g via INTRAVENOUS
  Filled 2016-02-29 (×11): qty 1000

## 2016-02-29 NOTE — Progress Notes (Addendum)
L&D Progress Note  Foley bulb was inserted at 1200 noon has now fallen out and cervix is now 3/50%/-1 to -2 Patient is very comfortable, contractions have spaced out FHR tracing is Cat1 GBS PPX has been started.  A: IOL for postdates in progress  P: Start Pitocin augmentation/induction.  Farrel ConnersGUTIERREZ, Donell Tomkins, CNM

## 2016-02-29 NOTE — Progress Notes (Signed)
L&D Progress Note:  20 year old G1 P0 with EDC=02/23/2016 admitted last night for IOL for postdates. Cervidil inserted last night around 2200.  S: Cervidil fell out while in BR. Feeling contractions. Was resting well this Am.  O: BP 127/85 mmHg  Pulse 94  Temp(Src) 98.6 F (37 C) (Oral)  Resp 20  Ht 5' (1.524 m)  Wt 68.04 kg (150 lb)  BMI 29.30 kg/m2  LMP 05/19/2015  General: appears in NAD, texting on phone FHR: 135 baseline with moderate variability, no decelerations Toco: contractions q2-4 minutes, mild Cervix: 1/50%/-1 to -2 EFW: 6#10oz on Leopold's  A: IUP at 40.6 weeks for IOL FWB: Cat 1 tracing GBS positive: PPX not yet begun  P: Can shower and eat light breakfast Start Ampicillin for GBS PPX Probably will insert foley bulb this AM  Myra Weng, CNM

## 2016-02-29 NOTE — Progress Notes (Signed)
Cervidil fell out while patient using restroom.

## 2016-03-01 ENCOUNTER — Encounter: Payer: Self-pay | Admitting: Anesthesiology

## 2016-03-01 ENCOUNTER — Encounter: Disposition: A | Discharge status: Home / Self Care | Payer: Self-pay | Attending: Certified Nurse Midwife

## 2016-03-01 ENCOUNTER — Inpatient Hospital Stay: Payer: Medicaid Other | Admitting: Anesthesiology

## 2016-03-01 LAB — CBC
HCT: 34.8 % — ABNORMAL LOW (ref 35.0–47.0)
Hemoglobin: 11.5 g/dL — ABNORMAL LOW (ref 12.0–16.0)
MCH: 27.6 pg (ref 26.0–34.0)
MCHC: 32.9 g/dL (ref 32.0–36.0)
MCV: 83.7 fL (ref 80.0–100.0)
PLATELETS: 346 10*3/uL (ref 150–440)
RBC: 4.16 MIL/uL (ref 3.80–5.20)
RDW: 14.2 % (ref 11.5–14.5)
WBC: 19.2 10*3/uL — AB (ref 3.6–11.0)

## 2016-03-01 SURGERY — Surgical Case
Anesthesia: Spinal | Wound class: Clean Contaminated

## 2016-03-01 MED ORDER — DIPHENHYDRAMINE HCL 25 MG PO CAPS: 25.0000 mg | ORAL_CAPSULE | ORAL | Status: DC | PRN

## 2016-03-01 MED ORDER — BUPIVACAINE HCL 0.5 % IJ SOLN
INTRAMUSCULAR | Status: DC | PRN
  Administered 2016-03-01: 10 mL

## 2016-03-01 MED ORDER — NALBUPHINE HCL 10 MG/ML IJ SOLN
5.0000 mg | INTRAMUSCULAR | Status: DC | PRN
  Filled 2016-03-01: qty 0.5

## 2016-03-01 MED ORDER — NALBUPHINE HCL 10 MG/ML IJ SOLN
5.0000 mg | Freq: Once | INTRAMUSCULAR | Status: DC | PRN
  Filled 2016-03-01: qty 0.5

## 2016-03-01 MED ORDER — ONDANSETRON HCL 4 MG/2ML IJ SOLN: 4.0000 mg | Freq: Three times a day (TID) | INTRAMUSCULAR | Status: DC | PRN

## 2016-03-01 MED ORDER — BUPIVACAINE HCL (PF) 0.5 % IJ SOLN
INTRAMUSCULAR | Status: AC
  Filled 2016-03-01: qty 30

## 2016-03-01 MED ORDER — ONDANSETRON HCL 4 MG/2ML IJ SOLN
INTRAMUSCULAR | Status: DC | PRN
  Administered 2016-03-01: 8 mg via INTRAVENOUS

## 2016-03-01 MED ORDER — FENTANYL CITRATE (PF) 100 MCG/2ML IJ SOLN: 25.0000 ug | INTRAMUSCULAR | Status: DC | PRN

## 2016-03-01 MED ORDER — MORPHINE SULFATE (PF) 0.5 MG/ML IJ SOLN
INTRAMUSCULAR | Status: DC | PRN
  Administered 2016-03-01: .1 mg via INTRATHECAL

## 2016-03-01 MED ORDER — NALOXONE HCL 2 MG/2ML IJ SOSY
1.0000 ug/kg/h | PREFILLED_SYRINGE | INTRAVENOUS | Status: DC | PRN
  Filled 2016-03-01: qty 2

## 2016-03-01 MED ORDER — ACETAMINOPHEN 650 MG RE SUPP
650.0000 mg | Freq: Once | RECTAL | Status: DC
  Filled 2016-03-01: qty 1

## 2016-03-01 MED ORDER — BUTORPHANOL TARTRATE 1 MG/ML IJ SOLN
1.0000 mg | INTRAMUSCULAR | Status: DC | PRN
  Administered 2016-03-01 (×5): 1 mg via INTRAVENOUS
  Filled 2016-03-01 (×4): qty 1

## 2016-03-01 MED ORDER — DEXTROSE 5 % IV SOLN
500.0000 mg | Freq: Once | INTRAVENOUS | Status: AC
  Administered 2016-03-01: 500 mg via INTRAVENOUS
  Filled 2016-03-01: qty 500

## 2016-03-01 MED ORDER — BUPIVACAINE HCL (PF) 0.5 % IJ SOLN: 10.0000 mL | Freq: Once | INTRAMUSCULAR | Status: DC

## 2016-03-01 MED ORDER — BUPIVACAINE IN DEXTROSE 0.75-8.25 % IT SOLN
INTRATHECAL | Status: DC | PRN
  Administered 2016-03-01: 1.6 mL via INTRATHECAL

## 2016-03-01 MED ORDER — SODIUM CHLORIDE 0.9% FLUSH: 3.0000 mL | INTRAVENOUS | Status: DC | PRN

## 2016-03-01 MED ORDER — BUPIVACAINE 0.25 % ON-Q PUMP DUAL CATH 400 ML
INJECTION | Status: AC
  Filled 2016-03-01: qty 400

## 2016-03-01 MED ORDER — NALOXONE HCL 0.4 MG/ML IJ SOLN
0.4000 mg | INTRAMUSCULAR | Status: DC | PRN
  Filled 2016-03-01: qty 1

## 2016-03-01 MED ORDER — BUPIVACAINE 0.25 % ON-Q PUMP DUAL CATH 400 ML: 400.0000 mL | INJECTION | Status: DC

## 2016-03-01 MED ORDER — CEFAZOLIN SODIUM-DEXTROSE 2-4 GM/100ML-% IV SOLN: 2.0000 g | INTRAVENOUS | Status: DC

## 2016-03-01 MED ORDER — ACETAMINOPHEN 500 MG PO TABS: 1000.0000 mg | ORAL_TABLET | Freq: Four times a day (QID) | ORAL | Status: DC

## 2016-03-01 MED ORDER — PHENYLEPHRINE HCL 10 MG/ML IJ SOLN
INTRAMUSCULAR | Status: DC | PRN
Start: 2016-03-01 — End: 2016-03-02
  Administered 2016-03-01 (×7): 100 ug via INTRAVENOUS

## 2016-03-01 MED ORDER — SOD CITRATE-CITRIC ACID 500-334 MG/5ML PO SOLN
30.0000 mL | ORAL | Status: AC
  Administered 2016-03-01: 30 mL via ORAL
  Filled 2016-03-01: qty 30

## 2016-03-01 MED ORDER — OXYCODONE HCL 5 MG PO TABS: 5.0000 mg | ORAL_TABLET | Freq: Once | ORAL | Status: DC | PRN

## 2016-03-01 MED ORDER — FENTANYL CITRATE (PF) 100 MCG/2ML IJ SOLN
INTRAMUSCULAR | Status: DC | PRN
  Administered 2016-03-01: 15 ug via INTRATHECAL

## 2016-03-01 MED ORDER — KETOROLAC TROMETHAMINE 30 MG/ML IJ SOLN
30.0000 mg | Freq: Four times a day (QID) | INTRAMUSCULAR | Status: DC | PRN
  Filled 2016-03-01: qty 1

## 2016-03-01 MED ORDER — CEFAZOLIN SODIUM-DEXTROSE 2-4 GM/100ML-% IV SOLN
INTRAVENOUS | Status: AC
  Filled 2016-03-01: qty 100

## 2016-03-01 MED ORDER — OXYCODONE HCL 5 MG/5ML PO SOLN
5.0000 mg | Freq: Once | ORAL | Status: DC | PRN
Start: 2016-03-01 — End: 2016-03-02

## 2016-03-01 MED ORDER — SODIUM CHLORIDE FLUSH 0.9 % IV SOLN
INTRAVENOUS | Status: AC
  Filled 2016-03-01: qty 10

## 2016-03-01 MED ORDER — METHYLERGONOVINE MALEATE 0.2 MG/ML IJ SOLN
INTRAMUSCULAR | Status: DC | PRN
  Administered 2016-03-01: 0.2 mg via INTRAMUSCULAR

## 2016-03-01 MED ORDER — KETOROLAC TROMETHAMINE 30 MG/ML IJ SOLN
30.0000 mg | Freq: Four times a day (QID) | INTRAMUSCULAR | Status: DC | PRN
  Administered 2016-03-02: 30 mg via INTRAVENOUS
  Filled 2016-03-01 (×2): qty 1

## 2016-03-01 MED ORDER — DIPHENHYDRAMINE HCL 50 MG/ML IJ SOLN: 12.5000 mg | INTRAMUSCULAR | Status: DC | PRN

## 2016-03-01 SURGICAL SUPPLY — 30 items
CANISTER SUCT 3000ML (MISCELLANEOUS) ×3 IMPLANT
CATH KIT ON-Q SILVERSOAK 5IN (CATHETERS) ×6 IMPLANT
CLOSURE WOUND 1/2 X4 (GAUZE/BANDAGES/DRESSINGS) ×1
DRSG TELFA 3X8 NADH (GAUZE/BANDAGES/DRESSINGS) ×3 IMPLANT
ELECT CAUTERY BLADE 6.4 (BLADE) ×3 IMPLANT
ELECT REM PT RETURN 9FT ADLT (ELECTROSURGICAL) ×3
ELECTRODE REM PT RTRN 9FT ADLT (ELECTROSURGICAL) ×1 IMPLANT
GAUZE SPONGE 4X4 12PLY STRL (GAUZE/BANDAGES/DRESSINGS) ×3 IMPLANT
GLOVE BIOGEL PI IND STRL 6.5 (GLOVE) ×3 IMPLANT
GLOVE BIOGEL PI INDICATOR 6.5 (GLOVE) ×6
GLOVE SURG SYN 6.5 ES PF (GLOVE) ×12 IMPLANT
GOWN STRL REUS W/ TWL LRG LVL3 (GOWN DISPOSABLE) ×3 IMPLANT
GOWN STRL REUS W/TWL LRG LVL3 (GOWN DISPOSABLE) ×6
LIQUID BAND (GAUZE/BANDAGES/DRESSINGS) ×3 IMPLANT
NS IRRIG 1000ML POUR BTL (IV SOLUTION) ×3 IMPLANT
PACK C SECTION AR (MISCELLANEOUS) ×3 IMPLANT
PAD OB MATERNITY 4.3X12.25 (PERSONAL CARE ITEMS) ×3 IMPLANT
PAD PREP 24X41 OB/GYN DISP (PERSONAL CARE ITEMS) ×3 IMPLANT
STRAP SAFETY BODY (MISCELLANEOUS) ×3 IMPLANT
STRIP CLOSURE SKIN 1/2X4 (GAUZE/BANDAGES/DRESSINGS) ×2 IMPLANT
SUT MNCRL 4-0 (SUTURE) ×2
SUT MNCRL 4-0 27XMFL (SUTURE) ×1
SUT PDS AB 1 TP1 96 (SUTURE) ×3 IMPLANT
SUT VIC AB 0 CT1 36 (SUTURE) ×6 IMPLANT
SUT VIC AB 2-0 CT1 27 (SUTURE) ×2
SUT VIC AB 2-0 CT1 TAPERPNT 27 (SUTURE) ×1 IMPLANT
SUT VIC AB 3-0 SH 27 (SUTURE) ×2
SUT VIC AB 3-0 SH 27X BRD (SUTURE) ×1 IMPLANT
SUTURE MNCRL 4-0 27XMF (SUTURE) ×1 IMPLANT
SWABSTK COMLB BENZOIN TINCTURE (MISCELLANEOUS) ×3 IMPLANT

## 2016-03-01 NOTE — Anesthesia Preprocedure Evaluation (Addendum)
Anesthesia Evaluation  Patient identified by MRN, date of birth, ID band Patient awake    Reviewed: Allergy & Precautions, H&P , NPO status , Patient's Chart, lab work & pertinent test results  History of Anesthesia Complications Negative for: history of anesthetic complications  Airway Mallampati: III  TM Distance: >3 FB Neck ROM: full    Dental  (+) Poor Dentition   Pulmonary neg shortness of breath, asthma ,    Pulmonary exam normal breath sounds clear to auscultation       Cardiovascular Exercise Tolerance: Good negative cardio ROS Normal cardiovascular exam Rhythm:regular Rate:Normal     Neuro/Psych negative neurological ROS  negative psych ROS   GI/Hepatic negative GI ROS, Neg liver ROS,   Endo/Other  negative endocrine ROS  Renal/GU negative Renal ROS  negative genitourinary   Musculoskeletal   Abdominal   Peds  Hematology negative hematology ROS (+)   Anesthesia Other Findings Past Surgical History:   CHOLECYSTECTOMY                                              BMI    Body Mass Index   29.29 kg/m 2      Reproductive/Obstetrics (+) Pregnancy                             Anesthesia Physical Anesthesia Plan  ASA: III  Anesthesia Plan: Spinal   Post-op Pain Management:    Induction:   Airway Management Planned:   Additional Equipment:   Intra-op Plan:   Post-operative Plan:   Informed Consent: I have reviewed the patients History and Physical, chart, labs and discussed the procedure including the risks, benefits and alternatives for the proposed anesthesia with the patient or authorized representative who has indicated his/her understanding and acceptance.   Dental Advisory Given  Plan Discussed with: Anesthesiologist, CRNA and Surgeon  Anesthesia Plan Comments:         Anesthesia Quick Evaluation

## 2016-03-01 NOTE — Progress Notes (Signed)
L&D Progress Note  S: Can I take a shower?  O: 137/79. Afebrile General: using Stadol frequently, getting short term relief of pain FHR 125 with moderate variability Toco: 3 contractions in 10 min, current mvus 155 with Pitocin at 10 miu. The contractions seem to increase shortly after an increase then get less intense. Has been on Pitocin x 18 hours Cervix: no change-still 4 cm.  A:  Possibly the Pitocin receptors are saturated P: Stop Pitocin for 1 hour, then restrat Pitocin Patient may shower  Benedetto Ryder, CNM

## 2016-03-01 NOTE — OR Nursing (Signed)
Time of birth: 2254 Sex: girl Weight: 6 lbs 6oz Apgar: 8   9     Length: 19 1/4inch

## 2016-03-01 NOTE — Progress Notes (Signed)
L&D Progress Note S: "I want a Cesarean section. I can't take this anymore. I don't want to do this. I don't want an epidural. I want to stop the Pitocin and take a shower. This is too much!" "I can't take this pain. " "I want this catheter out -it is hurting my vagina." Did not like nitrous oxide-made her vomit  O: afebrile. 148/83 General: sitting up in bed, venting frustrations with slow progress FHR: 130s with accelerations to 150s and moderate variability, mild variables with contractions and early decelerations IUPC: contractions q2-3 min apart with an occasional run of contractions, Pitocin now at 22 miu/min. MVUs now at 190 Cervix 4.5/90%/-1 (no change over last 1 hour)  A/PPatient wanting to stop induction at this time Highly recommended getting an epidural to cope with the pain, so that we can continue the induction, but she refuses Discussed pain following the cesarean section-having surgery does not stop the pain Unable to reason with patient, who has no support people in the room I have stopped the Pitocin and will let her take a shower if the baby looks fine Dr Elesa MassedWard consulted.  Jamie [name], Jamie [name], CNM

## 2016-03-01 NOTE — Anesthesia Procedure Notes (Addendum)
Spinal Patient location during procedure: OR Start time: 03/01/2016 10:23 PM End time: 03/01/2016 10:27 PM Staffing Anesthesiologist: Katy Fitch K Performed by: anesthesiologist  Preanesthetic Checklist Completed: patient identified, site marked, surgical consent, pre-op evaluation, timeout performed, IV checked, risks and benefits discussed and monitors and equipment checked Spinal Block Patient position: sitting Prep: Betadine Patient monitoring: heart rate, continuous pulse ox, blood pressure and cardiac monitor Approach: midline Location: L3-4 Injection technique: single-shot Needle Needle type: Whitacre and Introducer  Needle gauge: 24 G Needle length: 9 cm Assessment Sensory level: T5. Additional Notes Negative paresthesia. Negative blood return. Positive free-flowing CSF. Expiration date of kit checked and confirmed. Patient tolerated procedure well, without complications.    Date/Time: 03/01/2016 10:39 PM Performed by: Andria Frames Pre-anesthesia Checklist: Patient identified, Emergency Drugs available, Suction available, Patient being monitored and Timeout performed Oxygen Delivery Method: Nasal cannula

## 2016-03-01 NOTE — Progress Notes (Signed)
L&D Progress Note  After shower, patient spoke with significant other, the nurse taking care of her and myself Discussed the pros and cons of continuing the induction vs doing a Cesarean section at this point, when we were just starting to get her into adequate labor. Discussed risks of cesarean section including infection, bleeding, injury to other organs or blood vessels, and the risk of anesthesia. Sheand her SO are wanting to proceed with a Cesarean section and decline further efforts at induction and vaginal delivery.  Farrel ConnersGUTIERREZ, Kemaria Dedic, CNM

## 2016-03-01 NOTE — Progress Notes (Signed)
Ms. Jamie [name] is a Jamie Reichmann20yo G1 who is being induced for past due date. She was admitted on 6/9 and received cervidil, pitocin, AROM, and has not yet progressed past 4cm.  She demanded a cesarean and that efforts to get her into labor be stopped.    Several members of the health care team including myself have attempted to explain the process of induction and the time that it may take, to try to set reasonable expectations.  She does not care to continue this process, is refusing any further interventions.    I am obliged to deliver this baby safely, and due to rupture of membranes with meconium, my responsibility requires me to offer cesarean.  I pleaded with the patient to consider all of the significant risks between the surgery and a vaginal delivery, and she politely declined.    Consents were signed, and we discussed the application of the On-Q pump.  Patient states she understands the risks of open surgery, and confirms that this procedure is entirely elective, and would like to proceed.  Will move to OR when ready for Cesarean Delivery due to maternal refusal of induction.  ----- Ranae Plumberhelsea Naif Alabi, MD Attending Obstetrician and Gynecologist [facility]

## 2016-03-01 NOTE — Progress Notes (Signed)
L&D Progress Note  S: Contractions feeling stronger  O: BP 126/77 mmHg  Pulse 88  Temp(Src) 98.2 F (36.8 C) (Oral)  Resp 17  Ht 5' (1.524 m)  Wt 68.04 kg (150 lb)  BMI 29.30 kg/m2  SpO2 99%  LMP 05/19/2015  General: in NAD, not breathing thru contractions FHR: 135 with accelerations to 150, moderate variability Contractions q2 min apart on 13 miu/min Pitocin AROM for very light green meconium stained amniotic fluid Cervix: 4/70%/-1 IUPC inserted and Pitocin decreased to 6 miu/min  A: IOL in progress FWB: Cat1  P: Titrate Pitocin to 200 mvus Stadol or nitrous oxide or epidural if desires Continue to follow progress and monitor fetal well being  Kallen Mccrystal, CNM

## 2016-03-01 NOTE — Progress Notes (Signed)
L&D Progress Note  S: The contractions started feeling stronger about 2 hours ago  O: BP 110/55 mmHg  Pulse 76  Temp(Src) 98.7 F (37.1 C) (Oral)  Resp 18  Ht 5' (1.524 m)  Wt 68.04 kg (150 lb)  BMI 29.30 kg/m2  SpO2 99%  LMP 05/19/2015  General: in NAD, still texting on phone FHR:150-155 baseline with accelerations to 160s to 170, moderate variability Toco: q2-6 min apart with couplets and triplets on 12 miu/min Pitocin Cervix: 4/70%/-1  A: Some cervical progress FWB: Cat 1 tracing  P: Continue PItocin. Patient declined a rest over night Recheck in 2 hours Declines pain meds/epidural  Kristianne Albin, CNM

## 2016-03-02 ENCOUNTER — Encounter: Payer: Self-pay | Admitting: Obstetrics & Gynecology

## 2016-03-02 LAB — CBC
HCT: 31.1 % — ABNORMAL LOW (ref 35.0–47.0)
Hemoglobin: 10.5 g/dL — ABNORMAL LOW (ref 12.0–16.0)
MCH: 28.4 pg (ref 26.0–34.0)
MCHC: 33.8 g/dL (ref 32.0–36.0)
MCV: 84.1 fL (ref 80.0–100.0)
PLATELETS: 288 10*3/uL (ref 150–440)
RBC: 3.7 MIL/uL — AB (ref 3.80–5.20)
RDW: 14.1 % (ref 11.5–14.5)
WBC: 16.1 10*3/uL — ABNORMAL HIGH (ref 3.6–11.0)

## 2016-03-02 MED ORDER — MEDROXYPROGESTERONE ACETATE 150 MG/ML IM SUSP
150.0000 mg | INTRAMUSCULAR | Status: DC | PRN
Start: 2016-03-02 — End: 2016-03-04

## 2016-03-02 MED ORDER — SODIUM CHLORIDE 0.9% FLUSH: 3.0000 mL | INTRAVENOUS | Status: DC | PRN

## 2016-03-02 MED ORDER — BUPIVACAINE ON-Q PAIN PUMP (FOR ORDER SET NO CHG)
INJECTION | Status: DC
  Filled 2016-03-02: qty 1

## 2016-03-02 MED ORDER — SODIUM CHLORIDE 0.9% FLUSH: 3.0000 mL | Freq: Two times a day (BID) | INTRAVENOUS | Status: DC

## 2016-03-02 MED ORDER — PRENATAL MULTIVITAMIN CH
1.0000 | ORAL_TABLET | Freq: Every day | ORAL | Status: DC
  Administered 2016-03-03: 1 via ORAL
  Filled 2016-03-02: qty 1

## 2016-03-02 MED ORDER — TETANUS-DIPHTH-ACELL PERTUSSIS 5-2.5-18.5 LF-MCG/0.5 IM SUSP: 0.5000 mL | Freq: Once | INTRAMUSCULAR | Status: DC

## 2016-03-02 MED ORDER — SIMETHICONE 80 MG PO CHEW: 160.0000 mg | CHEWABLE_TABLET | Freq: Four times a day (QID) | ORAL | Status: DC | PRN

## 2016-03-02 MED ORDER — COCONUT OIL OIL: 1.0000 "application " | TOPICAL_OIL | Status: DC | PRN

## 2016-03-02 MED ORDER — LACTATED RINGERS IV SOLN: INTRAVENOUS | Status: DC

## 2016-03-02 MED ORDER — OXYTOCIN 40 UNITS IN LACTATED RINGERS INFUSION - SIMPLE MED
2.5000 [IU]/h | INTRAVENOUS | Status: DC
  Administered 2016-03-02: 2.5 [IU]/h via INTRAVENOUS
  Filled 2016-03-02 (×2): qty 1000

## 2016-03-02 MED ORDER — IBUPROFEN 600 MG PO TABS
600.0000 mg | ORAL_TABLET | Freq: Four times a day (QID) | ORAL | Status: DC
  Administered 2016-03-02 – 2016-03-03 (×4): 600 mg via ORAL
  Filled 2016-03-02 (×5): qty 1

## 2016-03-02 MED ORDER — SENNOSIDES-DOCUSATE SODIUM 8.6-50 MG PO TABS
2.0000 | ORAL_TABLET | ORAL | Status: DC
  Filled 2016-03-02: qty 2

## 2016-03-02 MED ORDER — DIBUCAINE 1 % RE OINT: 1.0000 "application " | TOPICAL_OINTMENT | RECTAL | Status: DC | PRN

## 2016-03-02 MED ORDER — OXYCODONE HCL 5 MG PO TABS
5.0000 mg | ORAL_TABLET | ORAL | Status: DC | PRN
Start: 2016-03-02 — End: 2016-03-04

## 2016-03-02 MED ORDER — ACETAMINOPHEN 500 MG PO TABS
1000.0000 mg | ORAL_TABLET | Freq: Four times a day (QID) | ORAL | Status: DC
  Administered 2016-03-02 (×2): 1000 mg via ORAL
  Filled 2016-03-02 (×2): qty 2

## 2016-03-02 MED ORDER — SODIUM CHLORIDE 0.9 % IV SOLN: 250.0000 mL | INTRAVENOUS | Status: DC

## 2016-03-02 MED ORDER — WITCH HAZEL-GLYCERIN EX PADS: 1.0000 "application " | MEDICATED_PAD | CUTANEOUS | Status: DC | PRN

## 2016-03-02 MED ORDER — OXYCODONE HCL 5 MG PO TABS
10.0000 mg | ORAL_TABLET | ORAL | Status: DC | PRN
Start: 2016-03-02 — End: 2016-03-04

## 2016-03-02 MED ORDER — MENTHOL 3 MG MT LOZG
1.0000 | LOZENGE | OROMUCOSAL | Status: DC | PRN
  Filled 2016-03-02: qty 9

## 2016-03-02 NOTE — Progress Notes (Signed)
Admit Date: 02/28/2016 Today's Date: 03/02/2016  Subjective: Postpartum Day 1: Cesarean Delivery Patient reports incisional pain and tolerating PO.    Objective: Vital signs in last 24 hours: Temp:  [97.8 F (36.6 C)-99.3 F (37.4 C)] 98.8 F (37.1 C) (06/12 0713) Pulse Rate:  [69-152] 90 (06/12 0713) Resp:  [18-20] 18 (06/12 0713) BP: (87-148)/(56-94) 116/68 mmHg (06/12 0713) SpO2:  [98 %-100 %] 100 % (06/12 0713)  Physical Exam:  General: alert, cooperative and no distress Lochia: appropriate Uterine Fundus: firm Incision: healing well DVT Evaluation: No evidence of DVT seen on physical exam.   Recent Labs  03/01/16 2038 03/02/16 0341  HGB 11.5* 10.5*  HCT 34.8* 31.1*    Assessment/Plan: Status post Cesarean section. Doing well postoperatively.  Continue current care.  Jamie [name] 03/02/2016, 10:28 AM

## 2016-03-02 NOTE — Plan of Care (Signed)
Problem: Urinary Elimination: Goal: Ability to reestablish a normal urinary elimination pattern will improve Outcome: Not Progressing Has Foley Cath. Post Op

## 2016-03-02 NOTE — Transfer of Care (Signed)
Immediate Anesthesia Transfer of Care Note  Patient: Jamie [name]  Procedure(s) Performed: Procedure(s): CESAREAN SECTION (N/A)  Patient Location: PACU  Anesthesia Type:General  Level of Consciousness: awake, alert  and oriented  Airway & Oxygen Therapy: Patient Spontanous Breathing  Post-op Assessment: Report given to RN and Post -op Vital signs reviewed and stable  Post vital signs: Reviewed and stable  Last Vitals:  Filed Vitals:   03/01/16 2019 03/02/16 0008  BP:    Pulse:    Temp: 37.4 C 36.9 C  Resp: 18     Last Pain:  Filed Vitals:   03/02/16 0008  PainSc: 8          Complications: No apparent anesthesia complications

## 2016-03-02 NOTE — Anesthesia Postprocedure Evaluation (Signed)
Anesthesia Post Note  Patient: Jamie [name]  Procedure(s) Performed: Procedure(s) (LRB): CESAREAN SECTION (N/A)  Patient location during evaluation: Mother Baby Anesthesia Type: Spinal Level of consciousness: awake and alert and oriented Pain management: satisfactory to patient Vital Signs Assessment: post-procedure vital signs reviewed and stable Respiratory status: respiratory function stable Cardiovascular status: stable Postop Assessment: no backache, no headache, spinal receding, patient able to bend at knees, no signs of nausea or vomiting and adequate PO intake Anesthetic complications: no    Last Vitals:  Filed Vitals:   03/02/16 0535 03/02/16 0713  BP: 110/58 116/68  Pulse: 72 90  Temp: 36.9 C 37.1 C  Resp: 18 18    Last Pain:  Filed Vitals:   03/02/16 0713  PainSc: 0-No pain                 Clydene PughBeane, Corie Allis D

## 2016-03-02 NOTE — Anesthesia Post-op Follow-up Note (Signed)
  Anesthesia Pain Follow-up Note  Patient: Jamie [name]  Day #: 1  Date of Follow-up: 03/02/2016 Time: 7:16 AM  Last Vitals:  Filed Vitals:   03/02/16 0535 03/02/16 0713  BP: 110/58 116/68  Pulse: 72 90  Temp: 36.9 C 37.1 C  Resp: 18 18    Level of Consciousness: alert  Pain: mild   Side Effects:None  Catheter Site Exam: site not evaluated  Plan: D/C from anesthesia care  Clydene PughBeane, [name] D

## 2016-03-02 NOTE — Op Note (Signed)
Cesarean Section Procedure Note  02/28/2016 - 03/02/2016  Patient:  Jamie [name]  20 y.o. female Preoperative diagnosis: elective c-section  Postoperative diagnosis: elective c-section, SGA infant, furcate umbilical cord.   PROCEDURE:  Procedure(s): CESAREAN SECTION (N/A) Surgeon:  Surgeon(s) and Role:    * Wenceslaus Gist C Cole Eastridge, MD - Primary    * Farrel Connersolleen Gutierrez, CNM - Assist Anesthesia:  spinal I/O: Total I/O In: 500 [I.V.:500] Out: 500 [Urine:100; Blood:400] Specimens:  Cord Blood Complications: None Apparent Disposition:  VS stable to PACU  Findings:  Live born female with apgars of 8,9, and weight of 6 lb 5.9 oz /2890 g (<10%ile for EGA), normal appearing tubes and ovaries bilaterally, a furcate and thin umbilical cord.    Indication for procedure: 20yo G1P0 @ 41.0 who was undergoing induction of labor due to past due date and refused interventions and requested a cesarean prior to achieving labor.    Procedure Details   The risks, benefits, complications, treatment options, and expected outcomes were discussed with the patient. Informed consent was obtained. The patient was taken to Operating Room, identified as Jamie [name] and the procedure verified as a cesarean delivery.   After administration of anesthesia, the patient was prepped and draped in the usual sterile manner, including a vaginal prep. A surgical time out was performed, with the pediatric team present. After confirming adequate anesthesia, a Pfannenstiel incision was made and carried down through the subcutaneous tissue to the fascia. Fascial incision was made and extended transversely. The fascia was separated from the underlying rectus tissue superiorly and inferiorly. The peritoneum was identified and entered. Peritoneal incision was extended longitudinally.  A low transverse uterine incision was made. Delivered from cephalic presentation was a Live born female. Delayed cord clamping was performed for 60 seconds,  during which time we sang Happy [place] OuchBirthday to baby Darnelle SpangleKaziah. The umbilical cord was doubly clamped and cut, and the baby was handed off to the awaitng neonatal NP.  Cord blood was obtained for evaluation. The placenta was removed intact and was noted to have division of the umbilical cord vessels prior to insertion into the placental disk. The uterus was cleared of clots, membranes, and debris. The uterus, tubes and ovaries appeared normal. The uterine incision was closed with running locking sutures of 0 Vicryl, and then a second, imbricating stitch was placed. Hemostasis was observed. The uterus remained boggy in spite of massage and pitocin, and Methergine 0.2mg  x1 IM was given. The abdominal cavity was evacuated of extraneous fluid. The uterus was returned to the abdominal cavity and again the incision was inspected for hemostasis, which was confirmed.  The paracolic gutters were cleaned. The on-q trochars were pierced through the skin and fascia.  The catheters were threaded through and tucked beneath the fascia.  The fascia was then reapproximated with running suture of vicryl. After a change of gloves, the subcutaneous tissue was irrigated and reapproximated with 3-0 vicryl. The skin was closed with 4-0 Monocryl and covered with surgical glue. The on-q pump was primed with 0.5% bupivacaine and a sterile dressing was applied.   Instrument, sponge, and needle counts were correct prior the abdominal closure and at the conclusion of the case.   I was present and performed this procedure in its entirety.  ----- Ranae Plumberhelsea Brazos Sandoval, MD Attending Obstetrician and Gynecologist [facility]

## 2016-03-02 NOTE — Discharge Instructions (Signed)
Discharge instructions:   Call office if you have any of the following: headache, visual changes, fever >101.0 F, chills, breast concerns, excessive vaginal bleeding, incision drainage or problems, leg pain or redness, depression or any other concerns.   Activity: Do not lift > 10 lbs for 6 weeks.  No intercourse or tampons for 6 weeks.  No driving for 1-2 weeks.   Call your doctor for increased pain or vaginal bleeding, temperature above 101.0, depression, incision issues, or other concerns.  No strenuous activity or heavy lifting for 6 weeks.  No intercourse, tampons, douching, or enemas for 6 weeks.  No tub baths-showers only.  No driving for 2 weeks or while taking pain medications.  Continue prenatal vitamin and iron.  Increase calories and fluids while breastfeeding.  You may have a slight fever when your milk comes in, but it should go away on its own.  If it does not, and rises above 101.0 please call the doctor.  For concerns about your baby, please call your pediatrician For breastfeeding concerns, the lactation consultant can be reached at [phone]
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
## 2016-03-03 LAB — SURGICAL PATHOLOGY

## 2016-03-03 NOTE — Progress Notes (Signed)
POD #2 Primary Cesarean section Subjective:   Tolerating regular diet. Voiding without difficulty. Passing flatus. Had a low grade fever around midnight 100.1. Denies sore throat or URI sx. Baby breastfeeding often  Objective:  Blood pressure 121/74, pulse 78, temperature 98.5 F (36.9 C), temperature source Oral, resp. rate 20, height 5' (1.524 m), weight 68.04 kg (150 lb), last menstrual period 05/19/2015, SpO2 100 %, unknown if currently breastfeeding.  General: NAD Heart: RRR with Grade II/VI systolic murmur at base Pulmonary: no increased work of breathing/ bases CTA Abdomen: non-distended, non-tender, fundus firm at U-2/ ML/ NT Incision: C+D+I. ON Q intact Extremities: no edema, no erythema, no tenderness  Results for orders placed or performed during the hospital encounter of 02/28/16 (from the past 72 hour(s))  CBC     Status: Abnormal   Collection Time: 03/01/16  8:38 PM  Result Value Ref Range   WBC 19.2 (H) 3.6 - 11.0 K/uL   RBC 4.16 3.80 - 5.20 MIL/uL   Hemoglobin 11.5 (L) 12.0 - 16.0 g/dL   HCT 16.134.8 (L) 09.635.0 - 04.547.0 %   MCV 83.7 80.0 - 100.0 fL   MCH 27.6 26.0 - 34.0 pg   MCHC 32.9 32.0 - 36.0 g/dL   RDW 40.914.2 81.111.5 - 91.414.5 %   Platelets 346 150 - 440 K/uL  CBC     Status: Abnormal   Collection Time: 03/02/16  3:41 AM  Result Value Ref Range   WBC 16.1 (H) 3.6 - 11.0 K/uL   RBC 3.70 (L) 3.80 - 5.20 MIL/uL   Hemoglobin 10.5 (L) 12.0 - 16.0 g/dL   HCT 78.231.1 (L) 95.635.0 - 21.347.0 %   MCV 84.1 80.0 - 100.0 fL   MCH 28.4 26.0 - 34.0 pg   MCHC 33.8 32.0 - 36.0 g/dL   RDW 08.614.1 57.811.5 - 46.914.5 %   Platelets 288 150 - 440 K/uL     Assessment:   20 y.o. G1P1001 postoperativeday # 2  Low grade fever 11 hours ago-monitor closely   Plan:  1) Acute blood loss anemia - hemodynamically stable and asymptomatic - po iron and vitamins  2) --/--/O POS (06/09 2134) / Rubella Immune (12/29 0000) / Varicella Immune  3) TDAP UTD  4) Breast  5) Disposition-possible discharge tomorrow  if no further fever  Analiese Krupka, CNM

## 2016-03-04 LAB — RPR, QUANT+TP ABS (REFLEX)
Rapid Plasma Reagin, Quant: 1:1 {titer} — ABNORMAL HIGH
T Pallidum Abs: NEGATIVE

## 2016-03-04 LAB — RPR: RPR Ser Ql: REACTIVE — AB

## 2016-03-04 MED ORDER — MEDROXYPROGESTERONE ACETATE 150 MG/ML IM SUSP
150.0000 mg | Freq: Once | INTRAMUSCULAR | Status: AC
  Administered 2016-03-04: 150 mg via INTRAMUSCULAR
  Filled 2016-03-04: qty 1

## 2016-03-04 MED ORDER — OXYCODONE HCL 5 MG PO TABS: 5.0000 mg | ORAL_TABLET | ORAL | Status: DC | PRN

## 2016-03-04 MED ORDER — MEDROXYPROGESTERONE ACETATE 150 MG/ML IM SUSP: 150.0000 mg | INTRAMUSCULAR | Status: DC

## 2016-03-04 MED ORDER — IBUPROFEN 600 MG PO TABS: 600.0000 mg | ORAL_TABLET | Freq: Four times a day (QID) | ORAL | Status: DC

## 2016-03-04 NOTE — Progress Notes (Signed)
Patient discharged home with infant. Vital signs stable, bleeding within normal limits, uterus firm. Discharge instructions, prescriptions, and follow up appointment given to and reviewed with patient. Patient verbalized understanding, all questions answered. Patient declined wheelchair, ambulated out escorted by father of baby.  Imagene ShellerMegan Calah Gershman, RN

## 2016-06-26 ENCOUNTER — Encounter: Payer: Self-pay | Admitting: Emergency Medicine

## 2016-06-26 DIAGNOSIS — Z5321 Procedure and treatment not carried out due to patient leaving prior to being seen by health care provider: Secondary | ICD-10-CM | POA: Insufficient documentation

## 2016-06-26 DIAGNOSIS — Z4800 Encounter for change or removal of nonsurgical wound dressing: Secondary | ICD-10-CM | POA: Insufficient documentation

## 2016-06-26 NOTE — ED Triage Notes (Signed)
Pt in with co red and swollen are to mid abd states unsure of insect bite.

## 2016-06-27 ENCOUNTER — Emergency Department
Admission: EM | Admit: 2016-06-27 | Discharge: 2016-06-27 | Disposition: A | Discharge status: Home / Self Care | Payer: Medicaid Other | Attending: Emergency Medicine | Admitting: Emergency Medicine

## 2016-06-27 NOTE — ED Notes (Signed)
Called to take to a room and no answer.

## 2016-09-27 ENCOUNTER — Emergency Department
Admission: EM | Admit: 2016-09-27 | Discharge: 2016-09-27 | Disposition: A | Discharge status: Home / Self Care | Payer: No Typology Code available for payment source | Attending: Emergency Medicine | Admitting: Emergency Medicine

## 2016-09-27 DIAGNOSIS — N938 Other specified abnormal uterine and vaginal bleeding: Secondary | ICD-10-CM | POA: Insufficient documentation

## 2016-09-27 DIAGNOSIS — Z79899 Other long term (current) drug therapy: Secondary | ICD-10-CM | POA: Insufficient documentation

## 2016-09-27 DIAGNOSIS — Z791 Long term (current) use of non-steroidal anti-inflammatories (NSAID): Secondary | ICD-10-CM | POA: Insufficient documentation

## 2016-09-27 DIAGNOSIS — N939 Abnormal uterine and vaginal bleeding, unspecified: Secondary | ICD-10-CM | POA: Diagnosis present

## 2016-09-27 LAB — URINALYSIS, COMPLETE (UACMP) WITH MICROSCOPIC
BACTERIA UA: NONE SEEN
BILIRUBIN URINE: NEGATIVE
Glucose, UA: NEGATIVE mg/dL
KETONES UR: NEGATIVE mg/dL
LEUKOCYTES UA: NEGATIVE
Nitrite: NEGATIVE
PH: 7 (ref 5.0–8.0)
Protein, ur: NEGATIVE mg/dL
Specific Gravity, Urine: 1.024 (ref 1.005–1.030)

## 2016-09-27 LAB — POCT PREGNANCY, URINE: Preg Test, Ur: NEGATIVE
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
## 2016-09-27 NOTE — ED Triage Notes (Signed)
Pt reports vaginal bleeding - she had depo shot in June then in October had period but in November and December she had no period (pregnancy test is negative at home) - now she is having pain in RLQ (history of ovarian cyst)

## 2016-12-26 ENCOUNTER — Encounter: Payer: Self-pay | Admitting: Emergency Medicine

## 2016-12-26 ENCOUNTER — Emergency Department
Admission: EM | Admit: 2016-12-26 | Discharge: 2016-12-26 | Disposition: A | Discharge status: Home / Self Care | Payer: Medicaid Other | Attending: Emergency Medicine | Admitting: Emergency Medicine

## 2016-12-26 DIAGNOSIS — R1031 Right lower quadrant pain: Secondary | ICD-10-CM | POA: Insufficient documentation

## 2016-12-26 DIAGNOSIS — Z5321 Procedure and treatment not carried out due to patient leaving prior to being seen by health care provider: Secondary | ICD-10-CM | POA: Insufficient documentation

## 2016-12-26 DIAGNOSIS — Z79899 Other long term (current) drug therapy: Secondary | ICD-10-CM | POA: Insufficient documentation

## 2016-12-26 LAB — COMPREHENSIVE METABOLIC PANEL
ALT: 13 U/L — AB (ref 14–54)
AST: 20 U/L (ref 15–41)
Albumin: 4.8 g/dL (ref 3.5–5.0)
Alkaline Phosphatase: 67 U/L (ref 38–126)
Anion gap: 8 (ref 5–15)
BILIRUBIN TOTAL: 1 mg/dL (ref 0.3–1.2)
BUN: 10 mg/dL (ref 6–20)
CALCIUM: 9.3 mg/dL (ref 8.9–10.3)
CO2: 27 mmol/L (ref 22–32)
CREATININE: 0.54 mg/dL (ref 0.44–1.00)
Chloride: 103 mmol/L (ref 101–111)
GFR calc Af Amer: >60 mL/min (ref >60)
GLUCOSE: 89 mg/dL (ref 65–99)
Potassium: 3.7 mmol/L (ref 3.5–5.1)
Sodium: 138 mmol/L (ref 135–145)
TOTAL PROTEIN: 7.9 g/dL (ref 6.5–8.1)

## 2016-12-26 LAB — URINALYSIS, COMPLETE (UACMP) WITH MICROSCOPIC
Bilirubin Urine: NEGATIVE
GLUCOSE, UA: NEGATIVE mg/dL
HGB URINE DIPSTICK: NEGATIVE
Ketones, ur: 5 mg/dL — AB
Leukocytes, UA: NEGATIVE
NITRITE: NEGATIVE
PH: 8 (ref 5.0–8.0)
Protein, ur: NEGATIVE mg/dL
SPECIFIC GRAVITY, URINE: 1.021 (ref 1.005–1.030)

## 2016-12-26 LAB — CBC
HCT: 38.2 % (ref 35.0–47.0)
Hemoglobin: 13.2 g/dL (ref 12.0–16.0)
MCH: 29.3 pg (ref 26.0–34.0)
MCHC: 34.4 g/dL (ref 32.0–36.0)
MCV: 85.2 fL (ref 80.0–100.0)
PLATELETS: 409 10*3/uL (ref 150–440)
RBC: 4.49 MIL/uL (ref 3.80–5.20)
RDW: 14.1 % (ref 11.5–14.5)
WBC: 8 10*3/uL (ref 3.6–11.0)

## 2016-12-26 LAB — POCT PREGNANCY, URINE: PREG TEST UR: NEGATIVE

## 2016-12-26 LAB — LIPASE, BLOOD: Lipase: 11 U/L (ref 11–51)

## 2016-12-26 NOTE — ED Triage Notes (Signed)
Lower R abdominal pain began yesterday, denies dysuria or pregnancy.

## 2016-12-29 ENCOUNTER — Telehealth: Payer: Self-pay | Admitting: Emergency Medicine

## 2016-12-29 NOTE — Telephone Encounter (Signed)
Called patient due to lwot to inquire about condition and follow up plans. She is doing okay now and is making appt with a pcp.  I told her that if she started having pain again to return here.  Otherwise, she needs to inform the pcp of the lab tests that were done here.

## 2017-03-21 ENCOUNTER — Emergency Department
Admission: EM | Admit: 2017-03-21 | Discharge: 2017-03-21 | Disposition: A | Discharge status: Home / Self Care | Payer: Self-pay | Attending: Emergency Medicine | Admitting: Emergency Medicine

## 2017-03-21 ENCOUNTER — Encounter: Payer: Self-pay | Admitting: Emergency Medicine

## 2017-03-21 ENCOUNTER — Emergency Department: Payer: Self-pay

## 2017-03-21 DIAGNOSIS — N83201 Unspecified ovarian cyst, right side: Secondary | ICD-10-CM

## 2017-03-21 DIAGNOSIS — N83291 Other ovarian cyst, right side: Secondary | ICD-10-CM | POA: Insufficient documentation

## 2017-03-21 LAB — URINALYSIS, COMPLETE (UACMP) WITH MICROSCOPIC
Bilirubin Urine: NEGATIVE
GLUCOSE, UA: NEGATIVE mg/dL
HGB URINE DIPSTICK: NEGATIVE
Ketones, ur: NEGATIVE mg/dL
NITRITE: NEGATIVE
PROTEIN: NEGATIVE mg/dL
Specific Gravity, Urine: 1.013 (ref 1.005–1.030)
pH: 7 (ref 5.0–8.0)

## 2017-03-21 LAB — WET PREP, GENITAL
Clue Cells Wet Prep HPF POC: NONE SEEN
SPERM: NONE SEEN
Trich, Wet Prep: NONE SEEN
YEAST WET PREP: NONE SEEN

## 2017-03-21 LAB — CBC
HCT: 36.9 % (ref 35.0–47.0)
HEMOGLOBIN: 12.6 g/dL (ref 12.0–16.0)
MCH: 29.2 pg (ref 26.0–34.0)
MCHC: 34.2 g/dL (ref 32.0–36.0)
MCV: 85.3 fL (ref 80.0–100.0)
Platelets: 383 10*3/uL (ref 150–440)
RBC: 4.32 MIL/uL (ref 3.80–5.20)
RDW: 14.4 % (ref 11.5–14.5)
WBC: 7.9 10*3/uL (ref 3.6–11.0)

## 2017-03-21 LAB — COMPREHENSIVE METABOLIC PANEL
ALK PHOS: 67 U/L (ref 38–126)
ALT: 25 U/L (ref 14–54)
ANION GAP: 5 (ref 5–15)
AST: 21 U/L (ref 15–41)
Albumin: 4.6 g/dL (ref 3.5–5.0)
BILIRUBIN TOTAL: 1.2 mg/dL (ref 0.3–1.2)
BUN: 13 mg/dL (ref 6–20)
CALCIUM: 9.4 mg/dL (ref 8.9–10.3)
CO2: 27 mmol/L (ref 22–32)
Chloride: 107 mmol/L (ref 101–111)
Creatinine, Ser: 1.03 mg/dL — ABNORMAL HIGH (ref 0.44–1.00)
GFR calc non Af Amer: >60 mL/min (ref >60)
Glucose, Bld: 85 mg/dL (ref 65–99)
Potassium: 4 mmol/L (ref 3.5–5.1)
Sodium: 139 mmol/L (ref 135–145)
TOTAL PROTEIN: 7.5 g/dL (ref 6.5–8.1)

## 2017-03-21 LAB — PREGNANCY, URINE: Preg Test, Ur: NEGATIVE

## 2017-03-21 LAB — CHLAMYDIA/NGC RT PCR (ARMC ONLY)
Chlamydia Tr: NOT DETECTED
N GONORRHOEAE: NOT DETECTED

## 2017-03-21 MED ORDER — IBUPROFEN 600 MG PO TABS
600.0000 mg | ORAL_TABLET | ORAL | Status: AC
  Administered 2017-03-21: 600 mg via ORAL
  Filled 2017-03-21: qty 1

## 2017-03-21 MED ORDER — ONDANSETRON 4 MG PO TBDP: 4.0000 mg | ORAL_TABLET | Freq: Four times a day (QID) | ORAL | 0 refills | Status: AC | PRN

## 2017-03-21 NOTE — Discharge Instructions (Signed)
You were seen in the emergency room for abdominal pain. It is important that you follow up closely with your primary care doctor or OBGYN in the next few days.  Please return to the emergency room right away if you are to develop a fever, severe nausea, your pain becomes severe or worsens, you are unable to keep food down, begin vomiting any dark or bloody fluid, you develop any dark or bloody stools, feel dehydrated, or other new concerns or symptoms arise.

## 2017-03-21 NOTE — ED Triage Notes (Signed)
Pt reports pressure behind her right eye for over 2 weeks; pain daily; denies head injury; pt also adds right lower quadrant pain for the same amount of time; had menstrual cycle for 1 day mid June and that was it, not her normal; denies vaginal discharge; denies urinary s/s; pt in no acute distress;

## 2017-03-21 NOTE — ED Notes (Signed)
Pt ambulatory with steady gait to stat registration inquiring about lab results and treatment room; understands she will be taken to treatment room as soon as available and she will get lab results from provider; waiting patiently;

## 2017-03-21 NOTE — ED Notes (Signed)
Pt awake in lobby, waiting patiently for treatment room
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
## 2017-03-21 NOTE — ED Notes (Signed)
Pt resting in lobby with eyes closed, resp even and unlabored; waiting for treatment room

## 2017-03-22 LAB — URINE CULTURE: Special Requests: NORMAL

## 2017-03-30 ENCOUNTER — Encounter: Payer: Self-pay | Admitting: *Deleted

## 2017-03-30 ENCOUNTER — Emergency Department
Admission: EM | Admit: 2017-03-30 | Discharge: 2017-03-30 | Disposition: A | Discharge status: Home / Self Care | Payer: Self-pay | Attending: Emergency Medicine | Admitting: Emergency Medicine

## 2017-03-30 ENCOUNTER — Emergency Department: Payer: Self-pay

## 2017-03-30 DIAGNOSIS — R1030 Lower abdominal pain, unspecified: Secondary | ICD-10-CM

## 2017-03-30 DIAGNOSIS — R1031 Right lower quadrant pain: Secondary | ICD-10-CM | POA: Insufficient documentation

## 2017-03-30 LAB — COMPREHENSIVE METABOLIC PANEL
ALK PHOS: 75 U/L (ref 38–126)
ALT: 24 U/L (ref 14–54)
ANION GAP: 8 (ref 5–15)
AST: 20 U/L (ref 15–41)
Albumin: 4.9 g/dL (ref 3.5–5.0)
BILIRUBIN TOTAL: 0.9 mg/dL (ref 0.3–1.2)
BUN: 9 mg/dL (ref 6–20)
CO2: 27 mmol/L (ref 22–32)
CREATININE: 0.68 mg/dL (ref 0.44–1.00)
Calcium: 9.9 mg/dL (ref 8.9–10.3)
Chloride: 104 mmol/L (ref 101–111)
GFR calc Af Amer: >60 mL/min (ref >60)
Glucose, Bld: 90 mg/dL (ref 65–99)
Potassium: 3.8 mmol/L (ref 3.5–5.1)
SODIUM: 139 mmol/L (ref 135–145)
TOTAL PROTEIN: 8 g/dL (ref 6.5–8.1)

## 2017-03-30 LAB — CBC
HCT: 40.2 % (ref 35.0–47.0)
HEMOGLOBIN: 13.5 g/dL (ref 12.0–16.0)
MCH: 28.6 pg (ref 26.0–34.0)
MCHC: 33.7 g/dL (ref 32.0–36.0)
MCV: 84.8 fL (ref 80.0–100.0)
Platelets: 417 10*3/uL (ref 150–440)
RBC: 4.74 MIL/uL (ref 3.80–5.20)
RDW: 14.2 % (ref 11.5–14.5)
WBC: 6.7 10*3/uL (ref 3.6–11.0)

## 2017-03-30 LAB — HCG, QUANTITATIVE, PREGNANCY

## 2017-03-30 LAB — LIPASE, BLOOD: LIPASE: 18 U/L (ref 11–51)

## 2017-03-30 MED ORDER — IOPAMIDOL (ISOVUE-300) INJECTION 61%
75.0000 mL | Freq: Once | INTRAVENOUS | Status: AC | PRN
  Administered 2017-03-30: 75 mL via INTRAVENOUS

## 2017-03-30 MED ORDER — IOPAMIDOL (ISOVUE-300) INJECTION 61%
30.0000 mL | Freq: Once | INTRAVENOUS | Status: AC | PRN
  Administered 2017-03-30: 30 mL via ORAL

## 2017-03-30 NOTE — ED Notes (Signed)
Patient transported to CT 

## 2017-03-30 NOTE — ED Notes (Signed)
Pt c/o loss of appetite with N/V over the past 3 weeks and thought she may have been pregnant but came here and was negative a week ago but was told it was a ruptured ovarian cyst. Pt states her sx have continued and has lost 20lbs over the past month with RLQ pain..Marland Kitchen
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
## 2017-03-30 NOTE — ED Triage Notes (Signed)
Pt reports right pelvic pain, pt seen her 1 week ago for ovarian cyst, pt reports having her menstral cycle which is heavier than normal

## 2018-03-13 ENCOUNTER — Emergency Department
Admission: EM | Admit: 2018-03-13 | Discharge: 2018-03-13 | Disposition: A | Discharge status: Home / Self Care | Payer: Medicaid Other | Attending: Emergency Medicine | Admitting: Emergency Medicine

## 2018-03-13 ENCOUNTER — Encounter: Payer: Self-pay | Admitting: Emergency Medicine

## 2018-03-13 ENCOUNTER — Emergency Department: Payer: Medicaid Other

## 2018-03-13 DIAGNOSIS — R1031 Right lower quadrant pain: Secondary | ICD-10-CM

## 2018-03-13 DIAGNOSIS — R1030 Lower abdominal pain, unspecified: Secondary | ICD-10-CM

## 2018-03-13 LAB — COMPREHENSIVE METABOLIC PANEL
ALBUMIN: 4.2 g/dL (ref 3.5–5.0)
ALK PHOS: 74 U/L (ref 38–126)
ALT: 16 U/L (ref 14–54)
AST: 18 U/L (ref 15–41)
Anion gap: 6 (ref 5–15)
BUN: 15 mg/dL (ref 6–20)
CALCIUM: 9.1 mg/dL (ref 8.9–10.3)
CO2: 26 mmol/L (ref 22–32)
Chloride: 105 mmol/L (ref 101–111)
Creatinine, Ser: 0.64 mg/dL (ref 0.44–1.00)
GFR calc Af Amer: >60 mL/min (ref >60)
GFR calc non Af Amer: >60 mL/min (ref >60)
GLUCOSE: 95 mg/dL (ref 65–99)
Potassium: 4.1 mmol/L (ref 3.5–5.1)
SODIUM: 137 mmol/L (ref 135–145)
Total Bilirubin: 0.6 mg/dL (ref 0.3–1.2)
Total Protein: 7.5 g/dL (ref 6.5–8.1)

## 2018-03-13 LAB — CBC
HCT: 38.4 % (ref 35.0–47.0)
Hemoglobin: 13 g/dL (ref 12.0–16.0)
MCH: 29.1 pg (ref 26.0–34.0)
MCHC: 33.8 g/dL (ref 32.0–36.0)
MCV: 86 fL (ref 80.0–100.0)
PLATELETS: 385 10*3/uL (ref 150–440)
RBC: 4.46 MIL/uL (ref 3.80–5.20)
RDW: 13.6 % (ref 11.5–14.5)
WBC: 8.8 10*3/uL (ref 3.6–11.0)

## 2018-03-13 LAB — URINALYSIS, COMPLETE (UACMP) WITH MICROSCOPIC
Bacteria, UA: NONE SEEN
Bilirubin Urine: NEGATIVE
GLUCOSE, UA: NEGATIVE mg/dL
Hgb urine dipstick: NEGATIVE
Ketones, ur: NEGATIVE mg/dL
Leukocytes, UA: NEGATIVE
Nitrite: NEGATIVE
PH: 8 (ref 5.0–8.0)
PROTEIN: NEGATIVE mg/dL
Specific Gravity, Urine: 1.02 (ref 1.005–1.030)

## 2018-03-13 LAB — PREGNANCY, URINE: Preg Test, Ur: NEGATIVE

## 2018-03-13 LAB — LIPASE, BLOOD: Lipase: 22 U/L (ref 11–51)

## 2018-03-13 MED ORDER — ONDANSETRON 4 MG PO TBDP: 4.0000 mg | ORAL_TABLET | Freq: Once | ORAL | Status: DC | PRN

## 2018-03-13 NOTE — ED Notes (Signed)
AAOx3.  Skin warm and dry.  NAD 

## 2018-03-13 NOTE — ED Triage Notes (Signed)
Patient presents to the ED with right lower quadrant pain since yesterday evening.  Patient reports vomiting x 4 and diarrhea x 2. Patient reports RLQ tenderness.  History of ovarian cysts and states pain feels similar.

## 2018-03-13 NOTE — ED Notes (Signed)
Patient transported to Ultrasound 
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
## 2018-08-20 ENCOUNTER — Encounter: Payer: Self-pay | Admitting: Emergency Medicine

## 2018-08-20 ENCOUNTER — Emergency Department: Payer: Self-pay

## 2018-08-20 ENCOUNTER — Emergency Department
Admission: EM | Admit: 2018-08-20 | Discharge: 2018-08-20 | Disposition: A | Discharge status: Home / Self Care | Payer: Self-pay | Attending: Emergency Medicine | Admitting: Emergency Medicine

## 2018-08-20 ENCOUNTER — Other Ambulatory Visit: Payer: Self-pay

## 2018-08-20 DIAGNOSIS — R1031 Right lower quadrant pain: Secondary | ICD-10-CM

## 2018-08-20 DIAGNOSIS — M545 Low back pain: Secondary | ICD-10-CM | POA: Insufficient documentation

## 2018-08-20 DIAGNOSIS — R102 Pelvic and perineal pain: Secondary | ICD-10-CM

## 2018-08-20 LAB — URINALYSIS, COMPLETE (UACMP) WITH MICROSCOPIC
Bilirubin Urine: NEGATIVE
Glucose, UA: NEGATIVE mg/dL
Hgb urine dipstick: NEGATIVE
Ketones, ur: NEGATIVE mg/dL
Nitrite: NEGATIVE
Protein, ur: NEGATIVE mg/dL
Specific Gravity, Urine: 1.013 (ref 1.005–1.030)
pH: 6 (ref 5.0–8.0)

## 2018-08-20 LAB — POCT PREGNANCY, URINE: PREG TEST UR: NEGATIVE

## 2018-08-20 MED ORDER — FLUCONAZOLE 150 MG PO TABS: 150.0000 mg | ORAL_TABLET | Freq: Once | ORAL | 0 refills | Status: AC

## 2018-08-20 NOTE — ED Notes (Signed)
Ultrasound added on. Pt made aware - she has her young child with her and will need someone to watch the child during the ultrasound. Pt is making calls.
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
## 2018-08-20 NOTE — Discharge Instructions (Signed)
Your exam and ultrasound are normal at this time. There is no indication of an ovarian cyst, fibroid, or pelvic findings to explain your symptoms. Take OTC Ibuprofen or naproxen as needed. [facility].

## 2018-08-20 NOTE — ED Notes (Signed)
Pt states hx of cyst on ovary - that she has rt back pain with an "irritation" that goes into her leg and is unsure if it has to do with her ovary. Pt in nad eating lunch with her child. Given a urine cup for sample.

## 2018-08-20 NOTE — ED Triage Notes (Signed)
Low back and R groin pain and tingling down R leg x 1 week.

## 2019-08-10 ENCOUNTER — Encounter: Payer: Self-pay | Admitting: Emergency Medicine

## 2019-08-10 ENCOUNTER — Other Ambulatory Visit: Payer: Self-pay

## 2019-08-10 ENCOUNTER — Emergency Department
Admission: EM | Admit: 2019-08-10 | Discharge: 2019-08-10 | Disposition: A | Discharge status: Home / Self Care | Payer: Medicaid Other | Attending: Emergency Medicine | Admitting: Emergency Medicine

## 2019-08-10 ENCOUNTER — Emergency Department: Payer: Medicaid Other

## 2019-08-10 DIAGNOSIS — Z3A23 23 weeks gestation of pregnancy: Secondary | ICD-10-CM | POA: Insufficient documentation

## 2019-08-10 DIAGNOSIS — R102 Pelvic and perineal pain: Secondary | ICD-10-CM | POA: Insufficient documentation

## 2019-08-10 DIAGNOSIS — N939 Abnormal uterine and vaginal bleeding, unspecified: Secondary | ICD-10-CM

## 2019-08-10 DIAGNOSIS — O2342 Unspecified infection of urinary tract in pregnancy, second trimester: Secondary | ICD-10-CM

## 2019-08-10 LAB — POCT PREGNANCY, URINE: Preg Test, Ur: POSITIVE — AB

## 2019-08-10 LAB — URINALYSIS, COMPLETE (UACMP) WITH MICROSCOPIC
Bilirubin Urine: NEGATIVE
Glucose, UA: NEGATIVE mg/dL
Hgb urine dipstick: NEGATIVE
Ketones, ur: NEGATIVE mg/dL
Nitrite: NEGATIVE
Protein, ur: 100 mg/dL — AB
RBC / HPF: >50 RBC/hpf — ABNORMAL HIGH (ref 0–5)
Specific Gravity, Urine: 1.02 (ref 1.005–1.030)
WBC, UA: >50 WBC/hpf — ABNORMAL HIGH (ref 0–5)
pH: 6 (ref 5.0–8.0)

## 2019-08-10 LAB — HCG, QUANTITATIVE, PREGNANCY: hCG, Beta Chain, Quant, S: 13372 m[IU]/mL — ABNORMAL HIGH (ref <5)

## 2019-08-10 LAB — ABO/RH: ABO/RH(D): O POS

## 2019-08-10 MED ORDER — O-CAL PRENATAL PO TABS: 1.0000 | ORAL_TABLET | Freq: Every day | ORAL | 3 refills | Status: AC

## 2019-08-10 MED ORDER — CEFPODOXIME PROXETIL 100 MG PO TABS: 100.0000 mg | ORAL_TABLET | Freq: Two times a day (BID) | ORAL | 0 refills | Status: AC

## 2019-08-10 NOTE — ED Triage Notes (Signed)
States went to a 3D ultrasound imaging clinic 2 weeks ago and had an US done and was told she was 20 weeks.  Patient did not bring any documentation of imaging.

## 2019-08-10 NOTE — ED Triage Notes (Signed)
Patient states she is [redacted] weeks pregnant and has been feeling fetal movmement.  Over past three days, states no fetal movmement, lower abdominal pain and vaginal bleeding today.  States bleeding is light spotting.Marland Kitchen  Has not had any prenatal care.  Periods irregular.  + home pregnancy test in October.
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
## 2019-08-10 NOTE — ED Notes (Signed)
Pt states abdominal pain had eased. States 0/10 pain.

## 2019-08-10 NOTE — ED Triage Notes (Signed)
First RN Note: Pt presents to ED via POV. Pt states has had no prenatal care but several weeks ago had +home pregnancy test. Pt states approx 2 weeks ago went to 3D [place] clinic and the [place] tech told her that she was approx [redacted] weeks pregnant. Pt states based on what the [place] tech told her she is approx [redacted] weeks pregnant. Pt states LMP was in September "but only lasted like 2 days". Pt states she has not felt the baby move in 3 days.   This RN called and spoke with Janett Billow L&D. Per Janett Billow and [place], L&D, due to patient not having confirmed pregnancy > 20 weeks will need [place] confirming pregnancy prior to patient going upstairs.

## 2019-08-10 NOTE — ED Notes (Signed)
Denies nausea or vomiting

## 2019-08-10 NOTE — Discharge Instructions (Addendum)
Call OBGYN to discuss your ER visit and to set up an appointment

## 2019-11-20 ENCOUNTER — Observation Stay
Admission: EM | Admit: 2019-11-20 | Discharge: 2019-11-20 | Disposition: A | Discharge status: Home / Self Care | Payer: Medicaid Other | Attending: Obstetrics and Gynecology | Admitting: Obstetrics and Gynecology

## 2019-11-20 ENCOUNTER — Encounter: Payer: Self-pay | Admitting: Certified Nurse Midwife

## 2019-11-20 ENCOUNTER — Other Ambulatory Visit: Payer: Self-pay

## 2019-11-20 DIAGNOSIS — O26893 Other specified pregnancy related conditions, third trimester: Secondary | ICD-10-CM | POA: Insufficient documentation

## 2019-11-20 DIAGNOSIS — M7989 Other specified soft tissue disorders: Secondary | ICD-10-CM | POA: Insufficient documentation

## 2019-11-20 DIAGNOSIS — R030 Elevated blood-pressure reading, without diagnosis of hypertension: Secondary | ICD-10-CM | POA: Insufficient documentation

## 2019-11-20 DIAGNOSIS — Z20822 Contact with and (suspected) exposure to covid-19: Secondary | ICD-10-CM | POA: Diagnosis not present

## 2019-11-20 DIAGNOSIS — Z3A38 38 weeks gestation of pregnancy: Secondary | ICD-10-CM | POA: Insufficient documentation

## 2019-11-20 DIAGNOSIS — O99891 Other specified diseases and conditions complicating pregnancy: Principal | ICD-10-CM | POA: Insufficient documentation

## 2019-11-20 DIAGNOSIS — O1203 Gestational edema, third trimester: Secondary | ICD-10-CM | POA: Diagnosis present

## 2019-11-20 DIAGNOSIS — O34211 Maternal care for low transverse scar from previous cesarean delivery: Secondary | ICD-10-CM | POA: Diagnosis not present

## 2019-11-20 LAB — CBC
HCT: 33.6 % — ABNORMAL LOW (ref 36.0–46.0)
Hemoglobin: 10.6 g/dL — ABNORMAL LOW (ref 12.0–15.0)
MCH: 24.5 pg — ABNORMAL LOW (ref 26.0–34.0)
MCHC: 31.5 g/dL (ref 30.0–36.0)
MCV: 77.8 fL — ABNORMAL LOW (ref 80.0–100.0)
Platelets: 416 10*3/uL — ABNORMAL HIGH (ref 150–400)
RBC: 4.32 MIL/uL (ref 3.87–5.11)
RDW: 14.8 % (ref 11.5–15.5)
WBC: 12.3 10*3/uL — ABNORMAL HIGH (ref 4.0–10.5)
nRBC: 0 % (ref 0.0–0.2)

## 2019-11-20 LAB — TYPE AND SCREEN
ABO/RH(D): O POS
Antibody Screen: NEGATIVE

## 2019-11-20 LAB — URINE DRUG SCREEN, QUALITATIVE (ARMC ONLY)
Amphetamines, Ur Screen: NOT DETECTED
Barbiturates, Ur Screen: NOT DETECTED
Benzodiazepine, Ur Scrn: NOT DETECTED
Cannabinoid 50 Ng, Ur ~~LOC~~: NOT DETECTED
Cocaine Metabolite,Ur ~~LOC~~: NOT DETECTED
MDMA (Ecstasy)Ur Screen: NOT DETECTED
Methadone Scn, Ur: NOT DETECTED
Opiate, Ur Screen: NOT DETECTED
Phencyclidine (PCP) Ur S: NOT DETECTED
Tricyclic, Ur Screen: NOT DETECTED

## 2019-11-20 LAB — COMPREHENSIVE METABOLIC PANEL
ALT: 10 U/L (ref 0–44)
AST: 12 U/L — ABNORMAL LOW (ref 15–41)
Albumin: 2.9 g/dL — ABNORMAL LOW (ref 3.5–5.0)
Alkaline Phosphatase: 230 U/L — ABNORMAL HIGH (ref 38–126)
Anion gap: 9 (ref 5–15)
BUN: 10 mg/dL (ref 6–20)
CO2: 21 mmol/L — ABNORMAL LOW (ref 22–32)
Calcium: 8.6 mg/dL — ABNORMAL LOW (ref 8.9–10.3)
Chloride: 108 mmol/L (ref 98–111)
Creatinine, Ser: 0.54 mg/dL (ref 0.44–1.00)
GFR calc Af Amer: >60 mL/min (ref >60)
GFR calc non Af Amer: >60 mL/min (ref >60)
Glucose, Bld: 78 mg/dL (ref 70–99)
Potassium: 3.8 mmol/L (ref 3.5–5.1)
Sodium: 138 mmol/L (ref 135–145)
Total Bilirubin: 0.6 mg/dL (ref 0.3–1.2)
Total Protein: 7 g/dL (ref 6.5–8.1)

## 2019-11-20 LAB — PROTEIN / CREATININE RATIO, URINE
Creatinine, Urine: 146 mg/dL
Protein Creatinine Ratio: 0.17 mg/mg{Cre} — ABNORMAL HIGH (ref 0.00–0.15)
Total Protein, Urine: 25 mg/dL

## 2019-11-20 LAB — RAPID HIV SCREEN (HIV 1/2 AB+AG)
HIV 1/2 Antibodies: NONREACTIVE
HIV-1 P24 Antigen - HIV24: NONREACTIVE

## 2019-11-20 LAB — HEPATITIS B SURFACE ANTIGEN: Hepatitis B Surface Ag: NONREACTIVE

## 2019-11-20 LAB — GROUP B STREP BY PCR: Group B strep by PCR: NEGATIVE

## 2019-11-20 MED ORDER — ZOLPIDEM TARTRATE 5 MG PO TABS: 5.0000 mg | ORAL_TABLET | Freq: Every evening | ORAL | Status: DC | PRN

## 2019-11-20 MED ORDER — ACETAMINOPHEN 325 MG PO TABS: 650.0000 mg | ORAL_TABLET | ORAL | Status: DC | PRN

## 2019-11-20 MED ORDER — DOCUSATE SODIUM 100 MG PO CAPS: 100.0000 mg | ORAL_CAPSULE | Freq: Every day | ORAL | Status: DC

## 2019-11-20 MED ORDER — PRENATAL MULTIVITAMIN CH: 1.0000 | ORAL_TABLET | Freq: Every day | ORAL | Status: DC

## 2019-11-20 MED ORDER — CALCIUM CARBONATE ANTACID 500 MG PO CHEW: 2.0000 | CHEWABLE_TABLET | ORAL | Status: DC | PRN

## 2019-11-20 NOTE — Discharge Summary (Signed)
Jamie [name] is a 24 y.o. female. She is at [redacted]w[redacted]d gestation. Patient's last menstrual period was 05/30/2019. Estimated Date of Delivery: 12/01/19  Prenatal care site: none  Current pregnancy complicated by:  1. No prenatal care, dating by US done at 23.6wks at [facility].  2. Prior LTCS 02/2016- IOL at late term-> pt refused ongoing induction/interventions at 4cm.  Chief complaint: swelling and foot/leg pain  Location: bilateral feet and legs Onset/timing: over last few days Duration: constant Quality: swelling and aching Severity: n/a Aggravating or alleviating conditions: no interventions tried.  Associated signs/symptoms: denies HA, VD or RUQ pain. Reports + FM.  Context: concerned bc her friend told her that swelling could be a sign of pre-eclampsia.   S: Resting comfortably. no CTX, no VB.no LOF,  Active fetal movement. Denies: HA, visual changes, SOB, or RUQ/epigastric pain  Maternal Medical History:   Past Medical History:  Diagnosis Date  . Medical history non-contributory     Past Surgical History:  Procedure Laterality Date  . CESAREAN SECTION N/A 03/01/2016   Procedure: CESAREAN SECTION;  Surgeon: Elenora Fender Ward, MD;  Location: [facility] ORS;  Service: Obstetrics;  Laterality: N/A;  . CHOLECYSTECTOMY      No Known Allergies  Prior to Admission medications   Medication Sig Start Date End Date Taking? Authorizing Provider  ondansetron (ZOFRAN ODT) 4 MG disintegrating tablet Take 1 tablet (4 mg total) by mouth every 6 (six) hours as needed for nausea or vomiting. 03/21/17   Sharyn Creamer, MD  Prenatal Vit-Fe Fumarate-FA (O-CAL PRENATAL) TABS Take 1 tablet by mouth daily. 08/10/19   Shaune Pollack, MD      Social History: She  reports that she has never smoked. She has never used smokeless tobacco. She reports that she does not drink alcohol or use drugs.  Family History: family history includes Cancer in her maternal grandfather; Diabetes in her maternal aunt.    Review of Systems: A full review of systems was performed and negative except as noted in the HPI.     O:  BP 129/78   Pulse 76   Temp 98.8 F (37.1 C) (Oral)   Resp 12   Wt 102.1 kg   LMP 05/30/2019   SpO2 97%   BMI 43.94 kg/m  Results for orders placed or performed during the hospital encounter of 11/20/19 (from the past 48 hour(s))  Protein / creatinine ratio, urine   Collection Time: 11/20/19  1:21 PM  Result Value Ref Range   Creatinine, Urine 146 mg/dL   Total Protein, Urine 25 mg/dL   Protein Creatinine Ratio 0.17 (H) 0.00 - 0.15 mg/mg[Cre]  Urine Drug Screen, Qualitative ([facility] only)   Collection Time: 11/20/19  1:21 PM  Result Value Ref Range   Tricyclic, Ur Screen NONE DETECTED NONE DETECTED   Amphetamines, Ur Screen NONE DETECTED NONE DETECTED   MDMA (Ecstasy)Ur Screen NONE DETECTED NONE DETECTED   Cocaine Metabolite,Ur [place] NONE DETECTED NONE DETECTED   Opiate, Ur Screen NONE DETECTED NONE DETECTED   Phencyclidine (PCP) Ur S NONE DETECTED NONE DETECTED   Cannabinoid 50 Ng, Ur [place] NONE DETECTED NONE DETECTED   Barbiturates, Ur Screen NONE DETECTED NONE DETECTED   Benzodiazepine, Ur Scrn NONE DETECTED NONE DETECTED   Methadone Scn, Ur NONE DETECTED NONE DETECTED  Rapid HIV screen [facility] L&D dept ONLY)   Collection Time: 11/20/19  1:29 PM  Result Value Ref Range   HIV-1 P24 Antigen - HIV24 NON REACTIVE NON REACTIVE   HIV 1/2  Antibodies NON REACTIVE NON REACTIVE   Interpretation (HIV Ag Ab)      A non reactive test result means that HIV 1 or HIV 2 antibodies and HIV 1 p24 antigen were not detected in the specimen.  Hepatitis B surface antigen   Collection Time: 11/20/19  1:29 PM  Result Value Ref Range   Hepatitis B Surface Ag NON REACTIVE NON REACTIVE  Comprehensive metabolic panel   Collection Time: 11/20/19  1:29 PM  Result Value Ref Range   Sodium 138 135 - 145 mmol/L   Potassium 3.8 3.5 - 5.1 mmol/L   Chloride 108 98 - 111 mmol/L   CO2 21 (L) 22 - 32  mmol/L   Glucose, Bld 78 70 - 99 mg/dL   BUN 10 6 - 20 mg/dL   Creatinine, Ser 0.54 0.44 - 1.00 mg/dL   Calcium 8.6 (L) 8.9 - 10.3 mg/dL   Total Protein 7.0 6.5 - 8.1 g/dL   Albumin 2.9 (L) 3.5 - 5.0 g/dL   AST 12 (L) 15 - 41 U/L   ALT 10 0 - 44 U/L   Alkaline Phosphatase 230 (H) 38 - 126 U/L   Total Bilirubin 0.6 0.3 - 1.2 mg/dL   GFR calc non Af Amer >60 >60 mL/min   GFR calc Af Amer >60 >60 mL/min   Anion gap 9 5 - 15  CBC on admission   Collection Time: 11/20/19  1:29 PM  Result Value Ref Range   WBC 12.3 (H) 4.0 - 10.5 K/uL   RBC 4.32 3.87 - 5.11 MIL/uL   Hemoglobin 10.6 (L) 12.0 - 15.0 g/dL   HCT 33.6 (L) 36.0 - 46.0 %   MCV 77.8 (L) 80.0 - 100.0 fL   MCH 24.5 (L) 26.0 - 34.0 pg   MCHC 31.5 30.0 - 36.0 g/dL   RDW 14.8 11.5 - 15.5 %   Platelets 416 (H) 150 - 400 K/uL   nRBC 0.0 0.0 - 0.2 %  Type and screen   Collection Time: 11/20/19  2:03 PM  Result Value Ref Range   ABO/RH(D) O POS    Antibody Screen NEG    Sample Expiration      11/23/2019,2359 Performed at [facility], [address] [place], Page [id number]   Group B strep by PCR   Collection Time: 11/20/19  2:40 PM   Specimen: Vaginal/Rectal; Genital  Result Value Ref Range   Group B strep by PCR NEGATIVE NEGATIVE     Constitutional: NAD, AAOx3  HE/ENT: extraocular movements grossly intact, moist mucous membranes CV: RRR PULM: nl respiratory effort, CTABL     Abd: gravid, non-tender, non-distended, soft      Ext: Non-tender, Nonedematous   Psych: mood appropriate, speech normal Pelvic: deferred.  Fetal  monitoring: Cat I Appropriate for GA, reactive NST Baseline: 130 bpm Variability: moderate Accelerations:  present x >2 Decelerations absent  TOCO: no UCs     A/P: 24 y.o. [redacted]w[redacted]d here for antenatal surveillance for swelling of LE  Principle Diagnosis:  High risk pregnancy in third trimester   Labor: not present.   Elevated BP x 1, no further elevated BPs noted. CBC, CMP WNL,  no proteinuria.   Routine Prenatal labs and UDS done.   Fetal Wellbeing: Reassuring Cat 1 tracing with Reactive NST   D/w Dr Leafy Ro, pt setup for scheduled elective repeat CS on 11/27/19, pre-op COVID testing and preadmission visits scheduled.   D/c home stable, precautions reviewed, follow-up as scheduled.  Randa Ngo, CNM 11/20/2019  9:17 PM

## 2019-11-20 NOTE — OB Triage Note (Signed)
Pt c/o pain and swelling in her legs. Limited PNC, EDD 11/30/19

## 2019-11-21 LAB — CHLAMYDIA/NGC RT PCR (ARMC ONLY)
Chlamydia Tr: NOT DETECTED
N gonorrhoeae: NOT DETECTED

## 2019-11-21 LAB — RUBELLA SCREEN: Rubella: 4.69 index (ref >0.99)

## 2019-11-21 LAB — RPR: RPR Ser Ql: NONREACTIVE

## 2019-11-21 LAB — VARICELLA ZOSTER ANTIBODY, IGG: Varicella IgG: 732 index (ref >165)

## 2019-11-23 ENCOUNTER — Encounter
Admit: 2019-11-23 | Discharge: 2019-11-23 | Disposition: A | Discharge status: Home / Self Care | Payer: Medicaid Other | Attending: Obstetrics and Gynecology | Admitting: Obstetrics and Gynecology

## 2019-11-23 ENCOUNTER — Other Ambulatory Visit: Payer: Self-pay

## 2019-11-23 HISTORY — DX: Unspecified asthma, uncomplicated: J45.909

## 2019-11-23 HISTORY — DX: Anemia, unspecified: D64.9

## 2019-11-23 NOTE — Patient Instructions (Signed)
Your procedure is scheduled on: 11/27/19 Report to  [place] at 8:30 am to get [facility]. Call [phone] for any questions.  Remember: Instructions that are not followed completely may result in serious medical risk, up to and including death, or upon the discretion of your surgeon and anesthesiologist your surgery may need to be rescheduled.     _X__ 1. Do not eat food after midnight the night before your procedure.                 No gum chewing or hard candies. You may drink clear liquids up to 2 hours                 before you are scheduled to arrive for your surgery- DO not drink clear                 liquids within 2 hours of the start of your surgery.                 Clear Liquids include:  water, apple juice without pulp, clear carbohydrate                 drink such as Clearfast or Gatorade, Black Coffee or Tea (Do not add                 anything to coffee or tea). Diabetics water only Ensure or Gatorade drink if supplied should be completed 2 hours prior to your arrival  __X__2.  On the morning of surgery brush your teeth with toothpaste and water, you                 may rinse your mouth with mouthwash if you wish.  Do not swallow any              toothpaste of mouthwash.     _X__ 3.  No Alcohol for 24 hours before or after surgery.   _X__ 4.  Do Not Smoke or use e-cigarettes For 24 Hours Prior to Your Surgery.                 Do not use any chewable tobacco products for at least 6 hours prior to                 surgery.  ____  5.  Bring all medications with you on the day of surgery if instructed.   __X__  6.  Notify your doctor if there is any change in your medical condition      (cold, fever, infections).     Do not wear jewelry, make-up, hairpins, clips or nail polish. Do not wear lotions, powders, or perfumes.  Do not shave 48 hours prior to surgery. Men may shave face and neck. Do not bring valuables to the hospital.    [facility] is not  responsible for any belongings or valuables.  Contacts, dentures/partials or body piercings may not be worn into surgery. Bring a case for your contacts, glasses or hearing aids, a denture cup will be supplied. Leave your suitcase in the car. After surgery it may be brought to your room. For patients admitted to the hospital, discharge time is determined by your treatment team.   Patients discharged the day of surgery will not be allowed to drive home.   Please read over the following fact sheets that you were given:   MRSA Information  __X__ Take these medicines the morning of surgery with  A SIP OF WATER:    1. none  2.   3.   4.  5.  6.  ____ Fleet Enema (as directed)   __X__ Use CHG Soap/SAGE wipes as directed  ____ Use inhalers on the day of surgery  ____ Stop metformin/Janumet/Farxiga 2 days prior to surgery    ____ Take 1/2 of usual insulin dose the night before surgery. No insulin the morning          of surgery.   ____ Stop Blood Thinners Coumadin/Plavix/Xarelto/Pleta/Pradaxa/Eliquis/Effient/Aspirin  on   Or contact your Surgeon, Cardiologist or Medical Doctor regarding  ability to stop your blood thinners  __X__ Stop Anti-inflammatories 7 days before surgery such as Advil, Ibuprofen, Motrin,  BC or Goodies Powder, Naprosyn, Naproxen, Aleve, Aspirin    __X__ Stop all herbal supplements, fish oil or vitamin E until after surgery.    ____ Bring C-Pap to the hospital.
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
## 2019-11-24 ENCOUNTER — Other Ambulatory Visit
Admit: 2019-11-24 | Discharge: 2019-11-24 | Disposition: A | Discharge status: Home / Self Care | Payer: Medicaid Other | Attending: Obstetrics and Gynecology | Admitting: Obstetrics and Gynecology

## 2019-11-24 DIAGNOSIS — O99891 Other specified diseases and conditions complicating pregnancy: Secondary | ICD-10-CM | POA: Diagnosis not present

## 2019-11-24 LAB — SARS CORONAVIRUS 2 (TAT 6-24 HRS): SARS Coronavirus 2: NEGATIVE

## 2019-11-27 ENCOUNTER — Encounter
Admission: RE | Disposition: A | Discharge status: Home / Self Care | Payer: Self-pay | Source: Home / Self Care | Attending: Obstetrics and Gynecology

## 2019-11-27 ENCOUNTER — Inpatient Hospital Stay: Payer: Medicaid Other | Admitting: Anesthesiology

## 2019-11-27 ENCOUNTER — Other Ambulatory Visit: Payer: Self-pay

## 2019-11-27 ENCOUNTER — Encounter: Payer: Self-pay | Admitting: Obstetrics & Gynecology

## 2019-11-27 ENCOUNTER — Inpatient Hospital Stay
Admission: RE | Admit: 2019-11-27 | Discharge: 2019-11-29 | DRG: 787 | Disposition: A | Discharge status: Home / Self Care | Payer: Medicaid Other | Attending: Obstetrics and Gynecology | Admitting: Obstetrics and Gynecology

## 2019-11-27 DIAGNOSIS — O9081 Anemia of the puerperium: Secondary | ICD-10-CM | POA: Diagnosis not present

## 2019-11-27 DIAGNOSIS — Z3A38 38 weeks gestation of pregnancy: Secondary | ICD-10-CM | POA: Diagnosis not present

## 2019-11-27 DIAGNOSIS — O0993 Supervision of high risk pregnancy, unspecified, third trimester: Secondary | ICD-10-CM

## 2019-11-27 DIAGNOSIS — O99214 Obesity complicating childbirth: Secondary | ICD-10-CM | POA: Diagnosis present

## 2019-11-27 DIAGNOSIS — O1002 Pre-existing essential hypertension complicating childbirth: Secondary | ICD-10-CM | POA: Diagnosis present

## 2019-11-27 DIAGNOSIS — D62 Acute posthemorrhagic anemia: Secondary | ICD-10-CM | POA: Diagnosis not present

## 2019-11-27 DIAGNOSIS — O34211 Maternal care for low transverse scar from previous cesarean delivery: Principal | ICD-10-CM | POA: Diagnosis present

## 2019-11-27 LAB — BASIC METABOLIC PANEL
Anion gap: 10 (ref 5–15)
BUN: 9 mg/dL (ref 6–20)
CO2: 18 mmol/L — ABNORMAL LOW (ref 22–32)
Calcium: 8.5 mg/dL — ABNORMAL LOW (ref 8.9–10.3)
Chloride: 109 mmol/L (ref 98–111)
Creatinine, Ser: 0.53 mg/dL (ref 0.44–1.00)
GFR calc Af Amer: >60 mL/min (ref >60)
GFR calc non Af Amer: >60 mL/min (ref >60)
Glucose, Bld: 83 mg/dL (ref 70–99)
Potassium: 4.1 mmol/L (ref 3.5–5.1)
Sodium: 137 mmol/L (ref 135–145)

## 2019-11-27 LAB — CBC
HCT: 33.1 % — ABNORMAL LOW (ref 36.0–46.0)
Hemoglobin: 10.2 g/dL — ABNORMAL LOW (ref 12.0–15.0)
MCH: 24.6 pg — ABNORMAL LOW (ref 26.0–34.0)
MCHC: 30.8 g/dL (ref 30.0–36.0)
MCV: 79.8 fL — ABNORMAL LOW (ref 80.0–100.0)
Platelets: 392 10*3/uL (ref 150–400)
RBC: 4.15 MIL/uL (ref 3.87–5.11)
RDW: 14.9 % (ref 11.5–15.5)
WBC: 12.3 10*3/uL — ABNORMAL HIGH (ref 4.0–10.5)
nRBC: 0 % (ref 0.0–0.2)

## 2019-11-27 LAB — TYPE AND SCREEN
ABO/RH(D): O POS
Antibody Screen: NEGATIVE

## 2019-11-27 LAB — GLUCOSE, CAPILLARY: Glucose-Capillary: 74 mg/dL (ref 70–99)

## 2019-11-27 LAB — RAPID HIV SCREEN (HIV 1/2 AB+AG)
HIV 1/2 Antibodies: NONREACTIVE
HIV-1 P24 Antigen - HIV24: NONREACTIVE

## 2019-11-27 SURGERY — Surgical Case
Anesthesia: Spinal

## 2019-11-27 MED ORDER — IBUPROFEN 800 MG PO TABS: 800.0000 mg | ORAL_TABLET | Freq: Four times a day (QID) | ORAL | Status: DC

## 2019-11-27 MED ORDER — LACTATED RINGERS IV SOLN: INTRAVENOUS | Status: DC

## 2019-11-27 MED ORDER — KETOROLAC TROMETHAMINE 30 MG/ML IJ SOLN
30.0000 mg | Freq: Four times a day (QID) | INTRAMUSCULAR | Status: AC
  Administered 2019-11-27 – 2019-11-28 (×4): 30 mg via INTRAVENOUS
  Filled 2019-11-27 (×4): qty 1

## 2019-11-27 MED ORDER — FENTANYL CITRATE (PF) 100 MCG/2ML IJ SOLN
INTRAMUSCULAR | Status: DC | PRN
  Administered 2019-11-27: 15 ug via INTRAVENOUS

## 2019-11-27 MED ORDER — BUPIVACAINE IN DEXTROSE 0.75-8.25 % IT SOLN
INTRATHECAL | Status: DC | PRN
  Administered 2019-11-27: 1.6 mL via INTRATHECAL

## 2019-11-27 MED ORDER — ACETAMINOPHEN 500 MG PO TABS
1000.0000 mg | ORAL_TABLET | Freq: Four times a day (QID) | ORAL | Status: AC
  Administered 2019-11-27 – 2019-11-28 (×4): 1000 mg via ORAL
  Filled 2019-11-27 (×4): qty 2

## 2019-11-27 MED ORDER — OXYCODONE HCL 5 MG PO TABS: 5.0000 mg | ORAL_TABLET | ORAL | Status: DC | PRN

## 2019-11-27 MED ORDER — NALBUPHINE HCL 10 MG/ML IJ SOLN: 5.0000 mg | INTRAMUSCULAR | Status: DC | PRN

## 2019-11-27 MED ORDER — BUPIVACAINE LIPOSOME 1.3 % IJ SUSP
20.0000 mL | Freq: Once | INTRAMUSCULAR | Status: DC
  Filled 2019-11-27: qty 20

## 2019-11-27 MED ORDER — KETOROLAC TROMETHAMINE 30 MG/ML IJ SOLN: 30.0000 mg | Freq: Four times a day (QID) | INTRAMUSCULAR | Status: AC

## 2019-11-27 MED ORDER — SENNOSIDES-DOCUSATE SODIUM 8.6-50 MG PO TABS
2.0000 | ORAL_TABLET | ORAL | Status: DC
  Administered 2019-11-27: 2 via ORAL
  Filled 2019-11-27 (×2): qty 2

## 2019-11-27 MED ORDER — OXYTOCIN 40 UNITS IN NORMAL SALINE INFUSION - SIMPLE MED
INTRAVENOUS | Status: AC
  Filled 2019-11-27: qty 1000

## 2019-11-27 MED ORDER — NALBUPHINE HCL 10 MG/ML IJ SOLN
5.0000 mg | INTRAMUSCULAR | Status: DC | PRN
  Administered 2019-11-27: 5 mg via INTRAVENOUS
  Filled 2019-11-27: qty 1

## 2019-11-27 MED ORDER — KETOROLAC TROMETHAMINE 30 MG/ML IJ SOLN: 30.0000 mg | Freq: Four times a day (QID) | INTRAMUSCULAR | Status: DC

## 2019-11-27 MED ORDER — MENTHOL 3 MG MT LOZG
1.0000 | LOZENGE | OROMUCOSAL | Status: DC | PRN
  Filled 2019-11-27: qty 9

## 2019-11-27 MED ORDER — FERROUS SULFATE 325 (65 FE) MG PO TABS
325.0000 mg | ORAL_TABLET | Freq: Two times a day (BID) | ORAL | Status: DC
  Administered 2019-11-28 – 2019-11-29 (×3): 325 mg via ORAL
  Filled 2019-11-27 (×3): qty 1

## 2019-11-27 MED ORDER — MORPHINE SULFATE (PF) 0.5 MG/ML IJ SOLN
INTRAMUSCULAR | Status: DC | PRN
  Administered 2019-11-27: .1 mg via EPIDURAL

## 2019-11-27 MED ORDER — SIMETHICONE 80 MG PO CHEW
80.0000 mg | CHEWABLE_TABLET | ORAL | Status: DC
  Administered 2019-11-27: 80 mg via ORAL
  Filled 2019-11-27 (×2): qty 1

## 2019-11-27 MED ORDER — SODIUM CHLORIDE 0.9 % IV SOLN
INTRAVENOUS | Status: DC | PRN
  Administered 2019-11-27: 50 ug/min via INTRAVENOUS

## 2019-11-27 MED ORDER — MEPERIDINE HCL 25 MG/ML IJ SOLN: 6.2500 mg | INTRAMUSCULAR | Status: DC | PRN

## 2019-11-27 MED ORDER — SIMETHICONE 80 MG PO CHEW
80.0000 mg | CHEWABLE_TABLET | Freq: Three times a day (TID) | ORAL | Status: DC
  Administered 2019-11-27 – 2019-11-28 (×4): 80 mg via ORAL
  Filled 2019-11-27 (×5): qty 1

## 2019-11-27 MED ORDER — NALBUPHINE HCL 10 MG/ML IJ SOLN
INTRAMUSCULAR | Status: AC
  Filled 2019-11-27: qty 1

## 2019-11-27 MED ORDER — NALBUPHINE HCL 10 MG/ML IJ SOLN
5.0000 mg | Freq: Once | INTRAMUSCULAR | Status: AC | PRN
  Administered 2019-11-27: 5 mg via INTRAVENOUS

## 2019-11-27 MED ORDER — ONDANSETRON HCL 4 MG/2ML IJ SOLN: 4.0000 mg | Freq: Three times a day (TID) | INTRAMUSCULAR | Status: DC | PRN

## 2019-11-27 MED ORDER — OXYTOCIN 40 UNITS IN NORMAL SALINE INFUSION - SIMPLE MED
2.5000 [IU]/h | INTRAVENOUS | Status: AC
  Administered 2019-11-27 (×2): 2.5 [IU]/h via INTRAVENOUS

## 2019-11-27 MED ORDER — FLEET ENEMA 7-19 GM/118ML RE ENEM: 1.0000 | ENEMA | Freq: Every day | RECTAL | Status: DC | PRN

## 2019-11-27 MED ORDER — SIMETHICONE 80 MG PO CHEW: 80.0000 mg | CHEWABLE_TABLET | ORAL | Status: DC | PRN

## 2019-11-27 MED ORDER — NALBUPHINE HCL 10 MG/ML IJ SOLN: 5.0000 mg | Freq: Once | INTRAMUSCULAR | Status: AC | PRN

## 2019-11-27 MED ORDER — FENTANYL CITRATE (PF) 100 MCG/2ML IJ SOLN
INTRAMUSCULAR | Status: AC
  Filled 2019-11-27: qty 2

## 2019-11-27 MED ORDER — DIBUCAINE (PERIANAL) 1 % EX OINT: 1.0000 "application " | TOPICAL_OINTMENT | CUTANEOUS | Status: DC | PRN

## 2019-11-27 MED ORDER — SODIUM CHLORIDE 0.9% FLUSH: 3.0000 mL | INTRAVENOUS | Status: DC | PRN

## 2019-11-27 MED ORDER — SODIUM CHLORIDE (PF) 0.9 % IJ SOLN
INTRAMUSCULAR | Status: AC
  Filled 2019-11-27: qty 50

## 2019-11-27 MED ORDER — BUPIVACAINE HCL (PF) 0.5 % IJ SOLN
INTRAMUSCULAR | Status: AC
  Filled 2019-11-27: qty 30

## 2019-11-27 MED ORDER — BISACODYL 10 MG RE SUPP: 10.0000 mg | Freq: Every day | RECTAL | Status: DC | PRN

## 2019-11-27 MED ORDER — GABAPENTIN 300 MG PO CAPS
300.0000 mg | ORAL_CAPSULE | Freq: Every day | ORAL | Status: DC
  Administered 2019-11-27 – 2019-11-28 (×2): 300 mg via ORAL
  Filled 2019-11-27 (×2): qty 1

## 2019-11-27 MED ORDER — SODIUM CHLORIDE 0.9% FLUSH
INTRAVENOUS | Status: DC | PRN
  Administered 2019-11-27 (×2): 50 mL

## 2019-11-27 MED ORDER — SCOPOLAMINE 1 MG/3DAYS TD PT72
1.0000 | MEDICATED_PATCH | Freq: Once | TRANSDERMAL | Status: DC
  Filled 2019-11-27: qty 1

## 2019-11-27 MED ORDER — BUPIVACAINE HCL (PF) 0.25 % IJ SOLN: INTRAMUSCULAR | Status: DC | PRN

## 2019-11-27 MED ORDER — BUPIVACAINE HCL (PF) 0.5 % IJ SOLN
INTRAMUSCULAR | Status: DC | PRN
  Administered 2019-11-27: 30 mL

## 2019-11-27 MED ORDER — WITCH HAZEL-GLYCERIN EX PADS: 1.0000 "application " | MEDICATED_PAD | CUTANEOUS | Status: DC | PRN

## 2019-11-27 MED ORDER — DIPHENHYDRAMINE HCL 50 MG/ML IJ SOLN
INTRAMUSCULAR | Status: DC | PRN
  Administered 2019-11-27: 12.5 mg via INTRAVENOUS

## 2019-11-27 MED ORDER — PRENATAL MULTIVITAMIN CH
1.0000 | ORAL_TABLET | Freq: Every day | ORAL | Status: DC
  Administered 2019-11-28: 1 via ORAL
  Filled 2019-11-27: qty 1

## 2019-11-27 MED ORDER — COCONUT OIL OIL: 1.0000 "application " | TOPICAL_OIL | Status: DC | PRN

## 2019-11-27 MED ORDER — PHENYLEPHRINE HCL (PRESSORS) 10 MG/ML IV SOLN
INTRAVENOUS | Status: DC | PRN
  Administered 2019-11-27 (×2): 100 ug via INTRAVENOUS

## 2019-11-27 MED ORDER — ONDANSETRON HCL 4 MG/2ML IJ SOLN
INTRAMUSCULAR | Status: DC | PRN
  Administered 2019-11-27: 4 mg via INTRAVENOUS

## 2019-11-27 MED ORDER — NALOXONE HCL 4 MG/10ML IJ SOLN
1.0000 ug/kg/h | INTRAVENOUS | Status: DC | PRN
  Filled 2019-11-27: qty 5

## 2019-11-27 MED ORDER — ACETAMINOPHEN 500 MG PO TABS
1000.0000 mg | ORAL_TABLET | Freq: Four times a day (QID) | ORAL | Status: DC
  Administered 2019-11-28 – 2019-11-29 (×2): 1000 mg via ORAL
  Filled 2019-11-27 (×4): qty 2

## 2019-11-27 MED ORDER — ACETAMINOPHEN 500 MG PO TABS: 1000.0000 mg | ORAL_TABLET | ORAL | Status: DC

## 2019-11-27 MED ORDER — TETANUS-DIPHTH-ACELL PERTUSSIS 5-2.5-18.5 LF-MCG/0.5 IM SUSP: 0.5000 mL | Freq: Once | INTRAMUSCULAR | Status: DC

## 2019-11-27 MED ORDER — BUPIVACAINE LIPOSOME 1.3 % IJ SUSP
INTRAMUSCULAR | Status: DC | PRN
  Administered 2019-11-27: 20 mL

## 2019-11-27 MED ORDER — SOD CITRATE-CITRIC ACID 500-334 MG/5ML PO SOLN
30.0000 mL | ORAL | Status: AC
  Administered 2019-11-27: 30 mL via ORAL
  Filled 2019-11-27: qty 30

## 2019-11-27 MED ORDER — OXYCODONE HCL 5 MG PO TABS: 10.0000 mg | ORAL_TABLET | ORAL | Status: DC | PRN

## 2019-11-27 MED ORDER — DIPHENHYDRAMINE HCL 25 MG PO CAPS: 25.0000 mg | ORAL_CAPSULE | Freq: Four times a day (QID) | ORAL | Status: DC | PRN

## 2019-11-27 MED ORDER — NALOXONE HCL 0.4 MG/ML IJ SOLN: 0.4000 mg | INTRAMUSCULAR | Status: DC | PRN

## 2019-11-27 MED ORDER — OXYTOCIN 40 UNITS IN NORMAL SALINE INFUSION - SIMPLE MED
INTRAVENOUS | Status: DC | PRN
  Administered 2019-11-27: 40 [IU] via INTRAVENOUS

## 2019-11-27 MED ORDER — ACETAMINOPHEN 500 MG PO TABS: 1000.0000 mg | ORAL_TABLET | Freq: Four times a day (QID) | ORAL | Status: DC

## 2019-11-27 MED ORDER — MEASLES, MUMPS & RUBELLA VAC IJ SOLR
0.5000 mL | Freq: Once | INTRAMUSCULAR | Status: DC
  Filled 2019-11-27: qty 0.5

## 2019-11-27 MED ORDER — KETOROLAC TROMETHAMINE 30 MG/ML IJ SOLN
30.0000 mg | Freq: Four times a day (QID) | INTRAMUSCULAR | Status: DC
  Administered 2019-11-27: 30 mg via INTRAVENOUS
  Filled 2019-11-27: qty 1

## 2019-11-27 MED ORDER — CEFAZOLIN SODIUM-DEXTROSE 2-4 GM/100ML-% IV SOLN
2.0000 g | INTRAVENOUS | Status: AC
  Administered 2019-11-27: 2 g via INTRAVENOUS
  Filled 2019-11-27 (×2): qty 100

## 2019-11-27 SURGICAL SUPPLY — 26 items
CANISTER SUCT 3000ML PPV (MISCELLANEOUS) ×3 IMPLANT
CHLORAPREP W/TINT 26 (MISCELLANEOUS) ×3 IMPLANT
COVER WAND RF STERILE (DRAPES) ×3 IMPLANT
DRSG TELFA 3X8 NADH (GAUZE/BANDAGES/DRESSINGS) ×3 IMPLANT
ELECT REM PT RETURN 9FT ADLT (ELECTROSURGICAL) ×3
ELECTRODE REM PT RTRN 9FT ADLT (ELECTROSURGICAL) ×1 IMPLANT
GAUZE SPONGE 4X4 12PLY STRL (GAUZE/BANDAGES/DRESSINGS) ×3 IMPLANT
GOWN STRL REUS W/ TWL LRG LVL3 (GOWN DISPOSABLE) ×3 IMPLANT
GOWN STRL REUS W/TWL LRG LVL3 (GOWN DISPOSABLE) ×6
NEEDLE HYPO 25GX1X1/2 BEV (NEEDLE) ×3 IMPLANT
NS IRRIG 1000ML POUR BTL (IV SOLUTION) ×3 IMPLANT
PACK C SECTION AR (MISCELLANEOUS) ×3 IMPLANT
PAD OB MATERNITY 4.3X12.25 (PERSONAL CARE ITEMS) ×3 IMPLANT
PAD PREP 24X41 OB/GYN DISP (PERSONAL CARE ITEMS) ×3 IMPLANT
PENCIL SMOKE ULTRAEVAC 22 CON (MISCELLANEOUS) ×3 IMPLANT
STAPLER INSORB 30 2030 C-SECTI (MISCELLANEOUS) ×3 IMPLANT
SUT CHROMIC 0 CT 1 (SUTURE) ×3 IMPLANT
SUT MNCRL 4-0 (SUTURE) ×2
SUT MNCRL 4-0 27XMFL (SUTURE) ×1
SUT VIC AB 0 CT1 36 (SUTURE) ×6 IMPLANT
SUT VIC AB 0 CTX 36 (SUTURE) ×4
SUT VIC AB 0 CTX36XBRD ANBCTRL (SUTURE) ×2 IMPLANT
SUT VIC AB 2-0 SH 27 (SUTURE) ×4
SUT VIC AB 2-0 SH 27XBRD (SUTURE) ×2 IMPLANT
SUTURE MNCRL 4-0 27XMF (SUTURE) ×1 IMPLANT
SYR 30ML LL (SYRINGE) ×6 IMPLANT

## 2019-11-27 NOTE — Op Note (Signed)
  Cesarean Section Procedure Note  Date of procedure: 11/27/2019   Pre-operative Diagnosis: Intrauterine pregnancy at [redacted]w[redacted]d;  - chronic HTN - no prenatal care  Post-operative Diagnosis: same, delivered.  Procedure: Repeat Low Transverse Cesarean Section through Pfannenstiel incision  Surgeon: Christeen Douglas, MD  Assistant(s):  Genia Del, CNM  Anesthesia: Spinal anesthesia  Anesthesiologist: Naomie Dean, MD Anesthesiologist: Yves Dill, MD; Naomie Dean, MD; Corinda Gubler, MD CRNA: Irving Burton, CRNA; Tyrell Antonio B, CRNA  Estimated Blood Loss:  200 mL         Drains: foley         Total IV Fluids: see anesthesia  Urine Output: see anaesthesia         Specimens: Cord blood         Complications:  None; patient tolerated the procedure well.         Disposition: PACU - hemodynamically stable.         Condition: stable  Findings:  A female infant in cephalic presentation. Amniotic fluid - Meconium  Birth weight 7#12oz  Apgars of 9 and 9 at one and five minutes respectively.  Intact placenta with a three-vessel cord.  Grossly normal uterus, tubes and ovaries bilaterally. Minimal intraabdominal adhesions were noted.  Indications: prior LTCS  Procedure Details  The patient was taken to Operating Room, identified as the correct patient and the procedure verified as C-Section Delivery. A formal Time Out was held with all team members present and in agreement.  After induction of anesthesia, the patient was draped and prepped in the usual sterile manner. A Pfannenstiel skin incision was made and carried down through the subcutaneous tissue to the fascia. Fascial incision was made and extended transversely with the Mayo scissors. The fascia was separated from the underlying rectus tissue superiorly and inferiorly. The peritoneum was identified and entered bluntly. Peritoneal incision was extended longitudinally. The utero-vesical peritoneal reflection  was incised transversely and a bladder flap was created digitally.   A low transverse hysterotomy was made. The fetus was delivered atraumatically. The umbilical cord was clamped x2 and cut and the infant was handed to the awaiting pediatricians. The placenta was removed intact and appeared normal, intact, and with a 3-vessel cord.   The uterus was exteriorized and cleared of all clot and debris. The hysterotomy was closed with running sutures of 0-Vicryl. A second imbricating layer was placed with the same suture. Excellent hemostasis was observed. The peritoneal cavity was cleared of all clots and debris. The uterus was returned to the abdomen.   The pelvis was irrigated and again, excellent hemostasis was noted. The fascia was then reapproximated with running sutures of 0 Vicryl.  The subcutaneous tissue was reapproximated with running sutures of 0 Cromic. The skin was reapproximated with Ensorb.  33ml (in 30 of 0.5% bupivicaine and 66ml of NSS) of liposomal bupivicaine placed in the fascial and skin lines.  Instrument, sponge, and needle counts were correct prior to the abdominal closure and at the conclusion of the case.   The patient tolerated the procedure well and was transferred to the recovery room in stable condition.   Christeen Douglas, MD 11/27/2019

## 2019-11-27 NOTE — Anesthesia Preprocedure Evaluation (Signed)
Anesthesia Evaluation  Patient identified by MRN, date of birth, ID band Patient awake    Reviewed: Allergy & Precautions, NPO status , Patient's Chart, lab work & pertinent test results  History of Anesthesia Complications Negative for: history of anesthetic complications  Airway Mallampati: III  TM Distance: >3 FB Neck ROM: Full    Dental no notable dental hx. (+) Teeth Intact   Pulmonary asthma , neg sleep apnea, neg COPD, Patient abstained from smoking.Not current smoker,  Mild asthma, does not need inhalers   Pulmonary exam normal breath sounds clear to auscultation       Cardiovascular Exercise Tolerance: Good METS(-) hypertension(-) CAD and (-) Past MI negative cardio ROS  (-) dysrhythmias  Rhythm:Regular Rate:Normal - Systolic murmurs    Neuro/Psych negative neurological ROS  negative psych ROS   GI/Hepatic neg GERD  ,(+)     (-) substance abuse  ,   Endo/Other  neg diabetesMorbid obesity  Renal/GU negative Renal ROS     Musculoskeletal   Abdominal   Peds  Hematology  (+) anemia , No hx of easy bleeding or bruising    Anesthesia Other Findings Past Medical History: No date: Anemia No date: Asthma No date: Medical history non-contributory  Reproductive/Obstetrics                             Anesthesia Physical Anesthesia Plan  ASA: III  Anesthesia Plan: Spinal   Post-op Pain Management:    Induction:   PONV Risk Score and Plan: 4 or greater and Ondansetron and Dexamethasone  Airway Management Planned: Natural Airway  Additional Equipment:   Intra-op Plan:   Post-operative Plan:   Informed Consent: I have reviewed the patients History and Physical, chart, labs and discussed the procedure including the risks, benefits and alternatives for the proposed anesthesia with the patient or authorized representative who has indicated his/her understanding and acceptance.        Plan Discussed with: CRNA and Surgeon  Anesthesia Plan Comments: (Discussed R/B/A of neuraxial anesthesia technique with patient: - rare risks of spinal/epidural hematoma, nerve damage, infection - Risk of PDPH - Risk of itching - Risk of nausea and vomiting - Risk of conversion to general anesthesia and its associated risks, including sore throat, damage to lips/teeth/oropharynx, and rare risks such as cardiac and respiratory events. - Risk of surgical bleeding requiring blood products Patient voiced understanding.)        Anesthesia Quick Evaluation

## 2019-11-27 NOTE — Anesthesia Procedure Notes (Signed)
Spinal  Patient location during procedure: OB Start time: 11/27/2019 11:30 AM End time: 11/27/2019 11:50 AM Staffing Performed: anesthesiologist  Anesthesiologist: Arita Miss, MD Resident/CRNA: Aline Brochure, CRNA Preanesthetic Checklist Completed: patient identified, IV checked, site marked, risks and benefits discussed, surgical consent, monitors and equipment checked, pre-op evaluation and timeout performed Spinal Block Patient position: sitting Prep: ChloraPrep Patient monitoring: heart rate, continuous pulse ox, blood pressure and cardiac monitor Approach: midline Location: L3-4 Injection technique: single-shot Needle Needle type: Whitacre and Introducer  Needle gauge: 24 G Needle length: 9 cm Assessment Sensory level: T10 Additional Notes Negative paresthesia. Negative blood return. Positive free-flowing CSF. Expiration date of kit checked and confirmed. Patient tolerated procedure well, without complications. Multiple insertion attempts due to difficulty locating spinal spaces

## 2019-11-27 NOTE — Transfer of Care (Signed)
Immediate Anesthesia Transfer of Care Note  Patient: Jamie [name]  Procedure(s) Performed: REPEAT CESAREAN SECTION (N/A )  Patient Location: PACU  Anesthesia Type:Spinal  Level of Consciousness: awake, alert  and oriented  Airway & Oxygen Therapy: Patient Spontanous Breathing  Post-op Assessment: Post -op Vital signs reviewed and stable  Post vital signs: stable  Last Vitals:  Vitals Value Taken Time  BP 120/66   Temp    Pulse 76   Resp 10   SpO2 97     Last Pain:  Vitals:   11/27/19 0910  TempSrc: Oral         Complications: No apparent anesthesia complications
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
## 2019-11-27 NOTE — Lactation Note (Signed)
This note was copied from a baby's chart. Lactation Consultation Note  Patient Name: Jamie [name] Today's Date: 11/27/2019   Mom breastfed her last baby for 3 1/2 months, but does not want to breast feed or pump for this baby.  She only wants to bottlefeed formula.  When questioned why she did not want to breast feed this baby, she says she is going to have to go back to work soon and did not want him to have any trouble taking a bottle.  Explained how she could still accomplish breast feeding now and introduce a bottle early enough so he should not get nipple confused.  She still declined.  If she changed her mind when mature milk starts to transition in, encouraged mom to call for lactation assistance but explained it would be harder if she did not begin stimulation now to bring in mature milk and ensure a plentiful breast milk supply.  If she was adamant about not wanting to breast feed then she would need to dry up her milk.  Discussed how to dry up her milk if that was her choice.  Discussed differences, signs and symptoms, prevention and treatment of full breasts, engorgement and mastitis if or when mature milk transitions in.  Information sheet given on infant formula preparation and reviewed.  Educated on when to use pacifiers if she chose to give one, cue-based feeding, volumes to give d/t newborn stomach size to prevent spitting and bonding making sure she is holding baby close with eye contact during bottle feeding.  Lactation number written on white board if she had further questions, concerns or needed assistance.    Maternal Data Formula Feeding for Exclusion: Yes Reason for exclusion: Mother's choice to formula feed on admision  Feeding Feeding Type: Bottle Fed - Formula Nipple Type: Regular  LATCH Score                   Interventions    Lactation Tools Discussed/Used     Consult Status      Louis Meckel 11/27/2019, 9:15 PM

## 2019-11-28 LAB — CBC
HCT: 30.7 % — ABNORMAL LOW (ref 36.0–46.0)
Hemoglobin: 9.4 g/dL — ABNORMAL LOW (ref 12.0–15.0)
MCH: 24.7 pg — ABNORMAL LOW (ref 26.0–34.0)
MCHC: 30.6 g/dL (ref 30.0–36.0)
MCV: 80.8 fL (ref 80.0–100.0)
Platelets: 320 10*3/uL (ref 150–400)
RBC: 3.8 MIL/uL — ABNORMAL LOW (ref 3.87–5.11)
RDW: 15.1 % (ref 11.5–15.5)
WBC: 11.9 10*3/uL — ABNORMAL HIGH (ref 4.0–10.5)
nRBC: 0 % (ref 0.0–0.2)

## 2019-11-28 LAB — RPR: RPR Ser Ql: NONREACTIVE

## 2019-11-28 MED ORDER — IBUPROFEN 800 MG PO TABS
800.0000 mg | ORAL_TABLET | Freq: Four times a day (QID) | ORAL | Status: DC
  Administered 2019-11-28 – 2019-11-29 (×2): 800 mg via ORAL
  Filled 2019-11-28 (×3): qty 1

## 2019-11-28 NOTE — Anesthesia Postprocedure Evaluation (Signed)
Anesthesia Post Note  Patient: Jamie [name]  Procedure(s) Performed: REPEAT CESAREAN SECTION (N/A )  Patient location during evaluation: Mother Baby Anesthesia Type: Spinal Level of consciousness: oriented and awake and alert Pain management: pain level controlled Vital Signs Assessment: post-procedure vital signs reviewed and stable Respiratory status: spontaneous breathing and respiratory function stable Cardiovascular status: blood pressure returned to baseline and stable Postop Assessment: no headache, no backache, no apparent nausea or vomiting and able to ambulate Anesthetic complications: no     Last Vitals:  Vitals:   11/27/19 2305 11/28/19 0818  BP: 107/75 98/69  Pulse: 65 73  Resp: 18 18  Temp: 36.4 C 36.6 C  SpO2: 100%     Last Pain:  Vitals:   11/28/19 0900  TempSrc:   PainSc: 0-No pain                 Brigid Vandekamp Lawerance Cruel

## 2019-11-28 NOTE — Anesthesia Post-op Follow-up Note (Signed)
  Anesthesia Pain Follow-up Note  Patient: Jamie [name]  Day #: 1  Date of Follow-up: 11/28/2019 Time: 10:29 AM  Last Vitals:  Vitals:   11/27/19 2305 11/28/19 0818  BP: 107/75 98/69  Pulse: 65 73  Resp: 18 18  Temp: 36.4 C 36.6 C  SpO2: 100%     Level of Consciousness: alert  Pain: none   Side Effects:None  Catheter Site Exam:clean, dry, no drainage     Plan: D/C from anesthesia care at surgeon's request  Karoline Caldwell

## 2019-11-28 NOTE — Progress Notes (Signed)
Subjective: Postpartum Day 1: Repeat Cesarean Delivery  Doing well, no concerns. Ambulating without difficulty, pain managed with PO meds, tolerating regular diet, and voiding without difficulty.   No fever/chills, chest pain, shortness of breath, nausea/vomiting, or leg pain. No nipple or breast pain.   Objective: Vital signs in last 24 hours: Temp:  [97.6 F (36.4 C)-99 F (37.2 C)] 97.8 F (36.6 C) (03/09 0818) Pulse Rate:  [62-86] 73 (03/09 0818) Resp:  [15-22] 18 (03/09 0818) BP: (98-135)/(65-87) 98/69 (03/09 0818) SpO2:  [88 %-100 %] 100 % (03/08 2305)  Physical Exam:  General: alert and cooperative Breasts: soft/nontender CV: RRR Pulm: nl effort, CTABL Abdomen: soft, non-tender Uterine Fundus: firm Incision: Dressing in place Perineum: intact Lochia: appropriate DVT Evaluation: No evidence of DVT seen on physical exam.  Recent Labs    11/27/19 0840 11/28/19 0517  HGB 10.2* 9.4*  HCT 33.1* 30.7*    Assessment/Plan: Status post Cesarean section. Doing well postoperatively.   -Continue routine postpartum care -Encouraged snug fitting bra, cold application, and Tylenol PRN for engorgement for formula feeding  -Acute blood loss anemia - hemodynamically stable and asymptomatic; start PO ferrous sulfate BID with stool softeners  -Immunization status: Needs Tdap prior to discharge - ordered  Disposition: Continue inpatient postpartum care   Jamie [name] 11/28/2019, 9:26 AM

## 2019-11-29 MED ORDER — IBUPROFEN 800 MG PO TABS: 800.0000 mg | ORAL_TABLET | Freq: Four times a day (QID) | ORAL | 0 refills | Status: AC

## 2019-11-29 MED ORDER — SENNOSIDES-DOCUSATE SODIUM 8.6-50 MG PO TABS: 2.0000 | ORAL_TABLET | ORAL | 0 refills | Status: AC

## 2019-11-29 MED ORDER — FERROUS SULFATE 325 (65 FE) MG PO TABS: 325.0000 mg | ORAL_TABLET | Freq: Two times a day (BID) | ORAL | 1 refills | Status: AC

## 2019-11-29 MED ORDER — ACETAMINOPHEN 500 MG PO TABS: 1000.0000 mg | ORAL_TABLET | Freq: Four times a day (QID) | ORAL | 0 refills | Status: AC

## 2019-11-29 MED ORDER — OXYCODONE HCL 5 MG PO TABS: 5.0000 mg | ORAL_TABLET | ORAL | 0 refills | Status: AC | PRN

## 2019-11-29 NOTE — Progress Notes (Signed)
Post Partum Day 2 Subjective: Doing well, no complaints.  Tolerating regular diet, pain with PO meds, voiding and ambulating without difficulty.  No CP SOB Fever,Chills, N/V or leg pain; denies nipple or breast pain no HA change of vision, RUQ/epigastric pain  Objective: BP 104/84 (BP Location: Left Arm)   Pulse 85   Temp 98 F (36.7 C) (Oral)   Resp 20   Ht 5' (1.524 m)   Wt 102.1 kg   LMP 05/30/2019   SpO2 99%   Breastfeeding Unknown   BMI 43.94 kg/m    Physical Exam:  General: NAD Breasts: soft/nontender CV: RRR Pulm: nl effort, CTABL Abdomen: soft, NT, BS x 4 Incision: Steristrips intact on rigth side of incision, no dehiscence, no erythema or drainage Lochia: small Uterine Fundus: fundus firm and 2 fb below umbilicus DVT Evaluation: no cords, ttp LEs   Recent Labs    11/27/19 0840 11/28/19 0517  HGB 10.2* 9.4*  HCT 33.1* 30.7*  WBC 12.3* 11.9*  PLT 392 320    Assessment/Plan: 24 y.o. G2P2002 postpartum day # 2  - Continue routine PP care; social work consult- No PNC - encouraged snug fitting bra and cabbage leaves for bottlefeeding.  - Discussed contraceptive options including implant, IUDs hormonal and non-hormonal, injection, pills/ring/patch, condoms, and NFP. Pt undecided.  - chronic anemia - hemodynamically stable and asymptomatic; continues po ferrous sulfate BID with stool softeners  - Immunization status: Needs tdap and flu prior to DC if desired.     Disposition: Does desire Dc home today.     Randa Ngo, CNM 11/29/2019  9:01 AM

## 2019-11-29 NOTE — Progress Notes (Signed)
Patient discharged home with infant. Discharge instructions and prescriptions given and reviewed with patient. Patient verbalized understanding. Will be escorted out by auxillary.  
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
## 2019-11-29 NOTE — Discharge Instructions (Signed)
Discharge Instructions:   Follow-up Appointment:  If there are any new medications, they have been ordered and will be available for pickup at the listed pharmacy on your way home from the hospital.   Call office if you have any of the following: headache, visual changes, fever >101.0 F, chills, shortness of breath, breast concerns, excessive vaginal bleeding, incision drainage or problems, leg pain or redness, depression or any other concerns. If you have vaginal discharge with an odor, let your doctor know.   It is normal to bleed for up to 6 weeks. You should not soak through more than 1 pad in 1 hour. If you have a blood clot larger than your fist with continued bleeding, call your doctor.   Activity: Do not lift > 10 lbs for 6 weeks (do not lift anything heavier than your baby). No intercourse, tampons, swimming pools, hot tubs, baths (only showers) for 6 weeks.  No driving for 1-2 weeks. Continue prenatal vitamin, especially if breastfeeding. Increase calories and fluids (water) while breastfeeding.   Your milk will come in, in the next couple of days (right now it is colostrum). You may have a slight fever when your milk comes in, but it should go away on its own.  If it does not, and rises above 101 F please call the doctor. You will also feel achy and your breasts will be firm. They will also start to leak. If you are breastfeeding, continue as you have been and you can pump/express milk for comfort.   If you have too much milk, your breasts can become engorged, which could lead to mastitis. This is an infection of the milk ducts. It can be very painful and you will need to notify your doctor to obtain a prescription for antibiotics. You can also treat it with a shower or hot/cold compress.   For concerns about your baby, please call your pediatrician.  For breastfeeding concerns, the lactation consultant can be reached at [phone].   Postpartum blues (feelings of happy one minute  and sad another minute) are normal for the first few weeks but if it gets worse let your doctor know.   Congratulations! We enjoyed caring for you and your new bundle of joy!    Cesarean Delivery, Care After This sheet gives you information about how to care for yourself after your procedure. Your health care provider may also give you more specific instructions. If you have problems or questions, contact your health care provider. What can I expect after the procedure? After the procedure, it is common to have:  A small amount of blood or clear fluid coming from the incision.  Some redness, swelling, and pain in your incision area.  Some abdominal pain and soreness.  Vaginal bleeding (lochia). Even though you did not have a vaginal delivery, you will still have vaginal bleeding and discharge.  Pelvic cramps.  Fatigue. You may have pain, swelling, and discomfort in the tissue between your vagina and your anus (perineum) if:  Your C-section was unplanned, and you were allowed to labor and push.  An incision was made in the area (episiotomy) or the tissue tore during attempted vaginal delivery. Follow these instructions at home: Incision care   Follow instructions from your health care provider about how to take care of your incision. Make sure you: ? Wash your hands with soap and water before you change your bandage (dressing). If soap and water are not available, use hand sanitizer. ? If you have   a dressing, change it or remove it as told by your health care provider. ? Leave stitches (sutures), skin staples, skin glue, or adhesive strips in place. These skin closures may need to stay in place for 2 weeks or longer. If adhesive strip edges start to loosen and curl up, you may trim the loose edges. Do not remove adhesive strips completely unless your health care provider tells you to do that.  Check your incision area every day for signs of infection. Check for: ? More redness,  swelling, or pain. ? More fluid or blood. ? Warmth. ? Pus or a bad smell.  Do not take baths, swim, or use a hot tub until your health care provider says it's okay. Ask your health care provider if you can take showers.  When you cough or sneeze, hug a pillow. This helps with pain and decreases the chance of your incision opening up (dehiscing). Do this until your incision heals. Medicines  Take over-the-counter and prescription medicines only as told by your health care provider.  If you were prescribed an antibiotic medicine, take it as told by your health care provider. Do not stop taking the antibiotic even if you start to feel better.  Do not drive or use heavy machinery while taking prescription pain medicine. Lifestyle  Do not drink alcohol. This is especially important if you are breastfeeding or taking pain medicine.  Do not use any products that contain nicotine or tobacco, such as cigarettes, e-cigarettes, and chewing tobacco. If you need help quitting, ask your health care provider. Eating and drinking  Drink at least 8 eight-ounce glasses of water every day unless told not to by your health care provider. If you breastfeed, you may need to drink even more water.  Eat high-fiber foods every day. These foods may help prevent or relieve constipation. High-fiber foods include: ? Whole grain cereals and breads. ? Brown rice. ? Beans. ? Fresh fruits and vegetables. Activity   If possible, have someone help you care for your baby and help with household activities for at least a few days after you leave the hospital.  Return to your normal activities as told by your health care provider. Ask your health care provider what activities are safe for you.  Rest as much as possible. Try to rest or take a nap while your baby is sleeping.  Do not lift anything that is heavier than 10 lbs (4.5 kg), or the limit that you were told, until your health care provider says that it is  safe.  Talk with your health care provider about when you can engage in sexual activity. This may depend on your: ? Risk of infection. ? How fast you heal. ? Comfort and desire to engage in sexual activity. General instructions  Do not use tampons or douches until your health care provider approves.  Wear loose, comfortable clothing and a supportive and well-fitting bra.  Keep your perineum clean and dry. Wipe from front to back when you use the toilet.  If you pass a blood clot, save it and call your health care provider to discuss. Do not flush blood clots down the toilet before you get instructions from your health care provider.  Keep all follow-up visits for you and your baby as told by your health care provider. This is important. Contact a health care provider if:  You have: ? A fever. ? Bad-smelling vaginal discharge. ? Pus or a bad smell coming from your incision. ? Difficulty   or pain when urinating. ? A sudden increase or decrease in the frequency of your bowel movements. ? More redness, swelling, or pain around your incision. ? More fluid or blood coming from your incision. ? A rash. ? Nausea. ? Little or no interest in activities you used to enjoy. ? Questions about caring for yourself or your baby.  Your incision feels warm to the touch.  Your breasts turn red or become painful or hard.  You feel unusually sad or worried.  You vomit.  You pass a blood clot from your vagina.  You urinate more than usual.  You are dizzy or light-headed. Get help right away if:  You have: ? Pain that does not go away or get better with medicine. ? Chest pain. ? Difficulty breathing. ? Blurred vision or spots in your vision. ? Thoughts about hurting yourself or your baby. ? New pain in your abdomen or in one of your legs. ? A severe headache.  You faint.  You bleed from your vagina so much that you fill more than one sanitary pad in one hour. Bleeding should not be  heavier than your heaviest period. Summary  After the procedure, it is common to have pain at your incision site, abdominal cramping, and slight bleeding from your vagina.  Check your incision area every day for signs of infection.  Tell your health care provider about any unusual symptoms.  Keep all follow-up visits for you and your baby as told by your health care provider. This information is not intended to replace advice given to you by your health care provider. Make sure you discuss any questions you have with your health care provider. Document Revised: 03/16/2018 Document Reviewed: 03/16/2018 Elsevier Patient Education  2020 [employer]  

## 2019-11-29 NOTE — Lactation Note (Signed)
This note was copied from a baby's chart. Lactation Consultation Note  Patient Name: Jamie [name] Today's Date: 11/29/2019   Upon entering the room, [name] was holding swaddled baby. [facility] [employer] [employer] [employer] student entered to give some discharge education. LC mentioned reeducated pace-bottle feeding and to feed 8-12xs in 24 hours. Cues were mentioned again and to cue based feed. Expectation of outputs and resources for when the dyad arrives home were mentioned as well.   [name] feels confident in going home and LC reassured her that she was doing well and would continue the progress.  [name] [name] 11/29/2019, 11:09 AM

## 2019-11-29 NOTE — Clinical Social Work Maternal (Signed)
CLINICAL SOCIAL WORK MATERNAL/CHILD NOTE  Patient Details  Name: RIFKY LAPRE MRN: [id number] Date of Birth: 1996/04/09  Date:  11/29/2019  Clinical Social Worker Initiating Note:  Meagan Hagwood Date/Time: Initiated:  11/29/19/0900     Child's Name:  Timoteo Gaul   Biological Parents:  Mother   Need for Interpreter:  None   Reason for Referral:  Late or No Prenatal Care    Address:  [place] [address]    Phone number:  [phone] (home)     Additional phone number:  Household Members/Support Persons (HM/SP):   Household Member/Support Person 1, Household Member/Support Person 2   HM/SP Name Relationship DOB or Age  HM/SP -1 Marisue Humble grandmother unknown  HM/SP -2 Rebecka Apley daughter 24 years old  HM/SP -3        HM/SP -4        HM/SP -5        HM/SP -6        HM/SP -7        HM/SP -8          Natural Supports (not living in the home):  Immediate Family, Friends   Chiropodist:     Employment: Unemployed   Type of Work:     Education:  9 to 11 years(Went to school through 12th grade, reported she did not Writer)   Homebound arranged: No  Financial Resources:  Medicaid(Applying for [employer])   Other Resources:  Physicist, medical , WIC(Applying for [employer])   Cultural/Religious Considerations Which May Impact Care:  None reported  Strengths:  Ability to meet basic needs , Compliance with medical plan , Home prepared for child , Pediatrician chosen   Psychotropic Medications:         Pediatrician:    [employer]  Pediatrician List:   [facility]      Pediatrician Fax Number:    Risk Factors/Current Problems:      Cognitive State:  Able to Concentrate , Alert , Goal Oriented , Insightful    Mood/Affect:  Calm , Happy , Comfortable    CSW Assessment: CSW was consulted due to late/limited [facility].  CSW met with [name] at bedside. Explained CSW role. [name] reported she did not have PNC due to not having insurance. [name] reported she is in the process of applying for Medicaid and WIC, and already has Physicist, medical. She plans to notify her Food Therapist, music of baby's birth. [name] reported she is feeling "good" since delivery and is happy to have her Baby. Baby's name is Timoteo Gaul. CSW confirmed [name]'s contact information. [name] reported FOB is not involved. She reported she lives with her grandmother Marisue Humble who is supportive, and also has a sister and other family who are involved and supportive. [name]'s sister will provide transport home and to/from appointments. [name] reported she also has her Drivers License and can drive herself. [name] has a 52 year old daughter who lives in the home, [place]. [name] reported Waynard Edwards family is also supportive. [name] reported she feels she has a good support system to meet Baby's needs. [name] reported she has situational anxiety, which she says occurs rarely and she feels her mental health is currently managed. She does not take any medications or see a counselor or therapist. [name] denied HI, SI, or DV. CSW provided education and  information sheets on PPD and SIDS. [name] verbalized understanding and denied questions or concerns. [name] plans to use [facility] in [place] as [facility] Pediatrician. She reported she has a bassinet, carseat (used-- CSW educated on car seats expiring. [name] reported this car seat is unexpired), and other items needed for Baby. CSW informed [name] of drug screen done on Baby due to late/limited [facility] and that CSW will be required to call CPS if this drug screen comes back positive, [name] verbalized understanding. [name] denied CPS history and also denied using any substances during pregnancy. [name] denied any needs or questions at this time and was encouraged to reach out if any arise. CSW will continue to follow for Baby's drug screen results.    CSW Plan/Description:  No  Further Intervention Required/No Barriers to Discharge    [place] Merida E Talha Iser, LCSW 11/29/2019, 11:14 AM

## 2020-05-16 ENCOUNTER — Other Ambulatory Visit: Payer: Self-pay

## 2020-11-23 ENCOUNTER — Other Ambulatory Visit: Payer: Self-pay

## 2020-11-23 ENCOUNTER — Emergency Department
Admission: EM | Admit: 2020-11-23 | Discharge: 2020-11-23 | Disposition: A | Discharge status: Home / Self Care | Payer: Medicaid Other | Attending: Emergency Medicine | Admitting: Emergency Medicine

## 2020-11-23 ENCOUNTER — Emergency Department: Payer: Medicaid Other

## 2020-11-23 ENCOUNTER — Encounter: Payer: Self-pay | Admitting: Emergency Medicine

## 2020-11-23 DIAGNOSIS — S82832A Other fracture of upper and lower end of left fibula, initial encounter for closed fracture: Secondary | ICD-10-CM | POA: Diagnosis not present

## 2020-11-23 DIAGNOSIS — Y9301 Activity, walking, marching and hiking: Secondary | ICD-10-CM | POA: Diagnosis not present

## 2020-11-23 DIAGNOSIS — S99912A Unspecified injury of left ankle, initial encounter: Secondary | ICD-10-CM | POA: Diagnosis present

## 2020-11-23 DIAGNOSIS — W108XXA Fall (on) (from) other stairs and steps, initial encounter: Secondary | ICD-10-CM | POA: Diagnosis not present

## 2020-11-23 DIAGNOSIS — J45909 Unspecified asthma, uncomplicated: Secondary | ICD-10-CM | POA: Diagnosis not present

## 2020-11-23 MED ORDER — HYDROCODONE-ACETAMINOPHEN 5-325 MG PO TABS: 1.0000 | ORAL_TABLET | ORAL | 0 refills | Status: AC | PRN

## 2020-11-23 MED ORDER — MORPHINE SULFATE (PF) 4 MG/ML IV SOLN
4.0000 mg | Freq: Once | INTRAVENOUS | Status: AC
  Administered 2020-11-23: 4 mg via INTRAVENOUS
  Filled 2020-11-23: qty 1

## 2020-11-23 MED ORDER — ONDANSETRON HCL 4 MG/2ML IJ SOLN
4.0000 mg | Freq: Once | INTRAMUSCULAR | Status: AC
  Administered 2020-11-23: 4 mg via INTRAVENOUS
  Filled 2020-11-23: qty 2

## 2020-11-23 NOTE — ED Notes (Signed)
E-signature pad not working in room, unable to get pt's discharge e-signature. Paper copy signed by patient and patient verbalized understanding of discharge instructions.

## 2020-11-23 NOTE — Discharge Instructions (Signed)
Please take your pain medication as needed, as prescribed.  Please follow-up with orthopedics by calling the number provided Monday morning to arrange a follow-up appointment this coming week.  Return to the emergency department for any worsening pain or any other symptom personally concerning to yourself.

## 2020-11-23 NOTE — ED Notes (Signed)
X-Ray at bedside.

## 2020-11-23 NOTE — ED Notes (Signed)
Pt given warm blanket at this time 
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
## 2020-11-23 NOTE — ED Triage Notes (Signed)
Pt arrived to ED via [facility] from home with c/o ankle deformity. Pt stated she was walking down steps, slipped, rolled ankle and heard it pop.   Pt to ED02A with obvious swelling and deformity to L ankle.  Per EMS pt was found on couch at home, states pt had assistance to help her up.  Per EMS pt was in 10/10 pain, gave fentanyl prior to ED arrival.  Pt in NAD, A&Ox4, VSS.

## 2021-04-25 IMAGING — US US OB LIMITED
1 series · 14 of 21 positions shown · non-contrast
Comparison: none

CLINICAL DATA: Vaginal bleeding with pain

EXAM:
LIMITED OBSTETRIC ULTRASOUND

[Series 1: us ob limited · 14 of 21 slices shown]
[im 1/21]
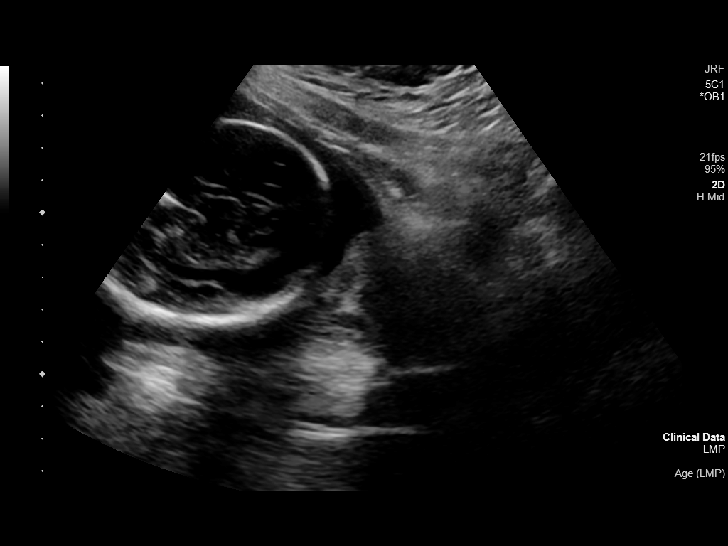
[im 3/21]
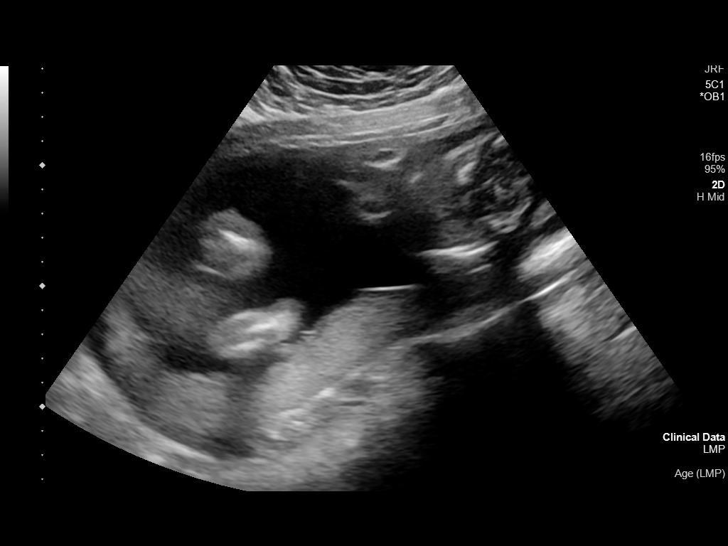
[im 4/21]
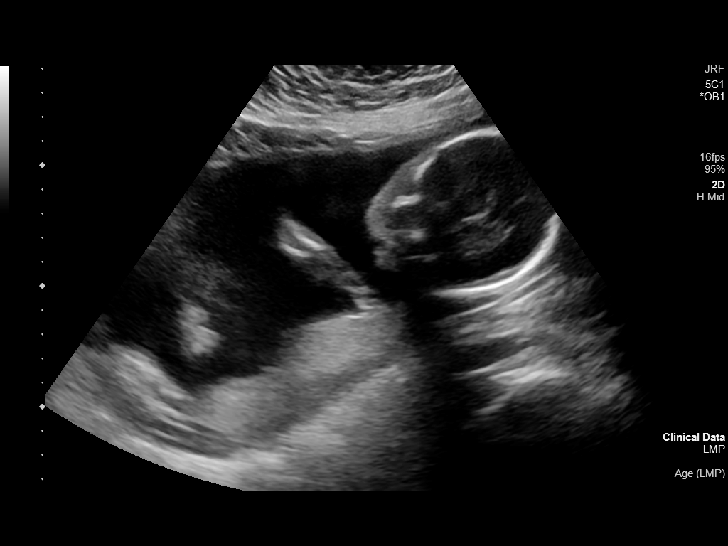
[im 6/21]
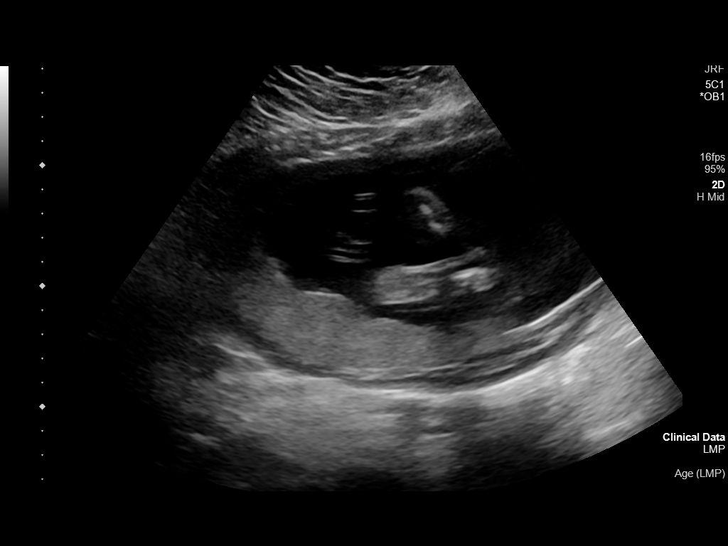
[im 7/21]
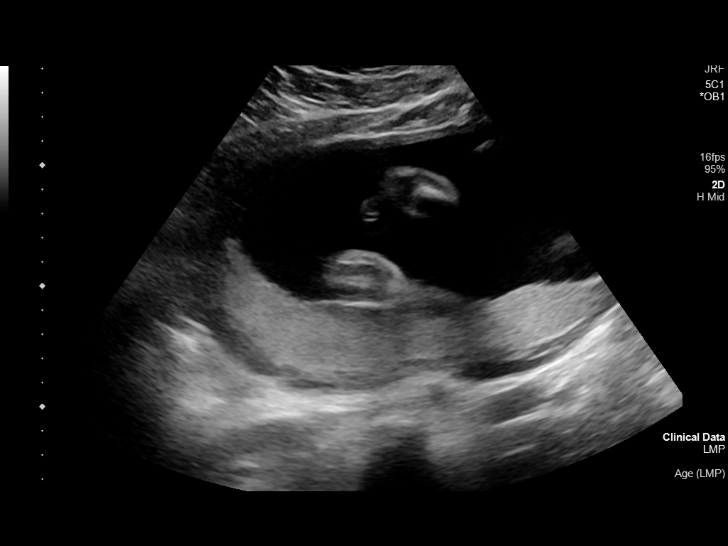
[im 9/21]
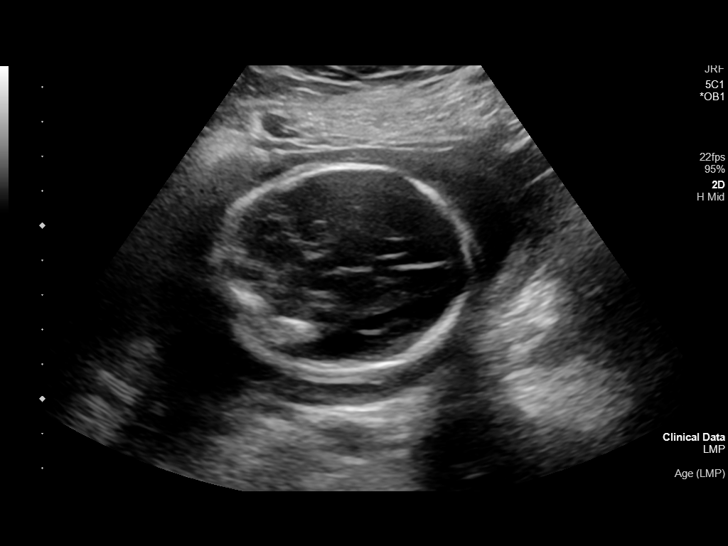
[im 10/21]
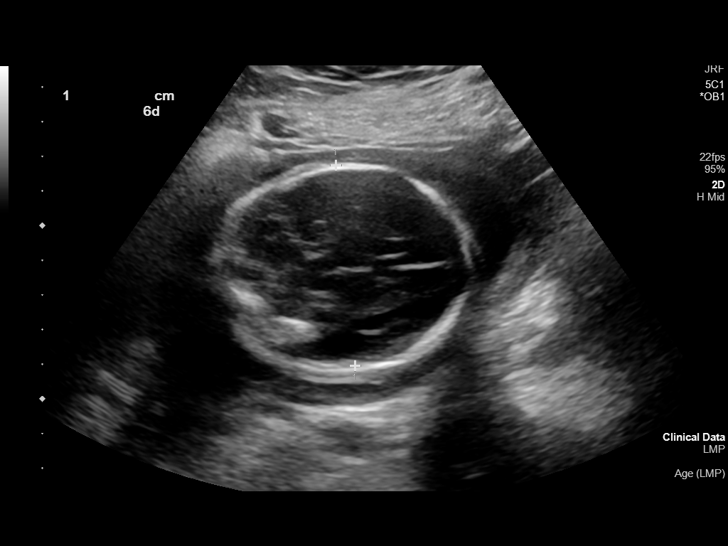
[im 12/21]
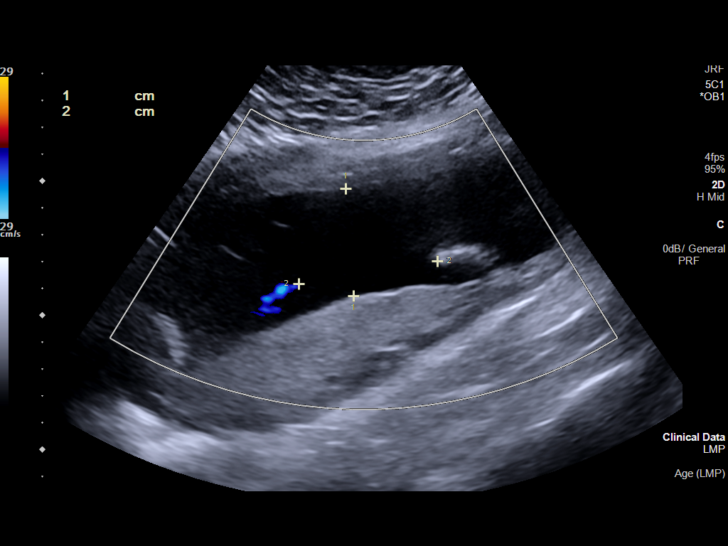
[im 13/21]
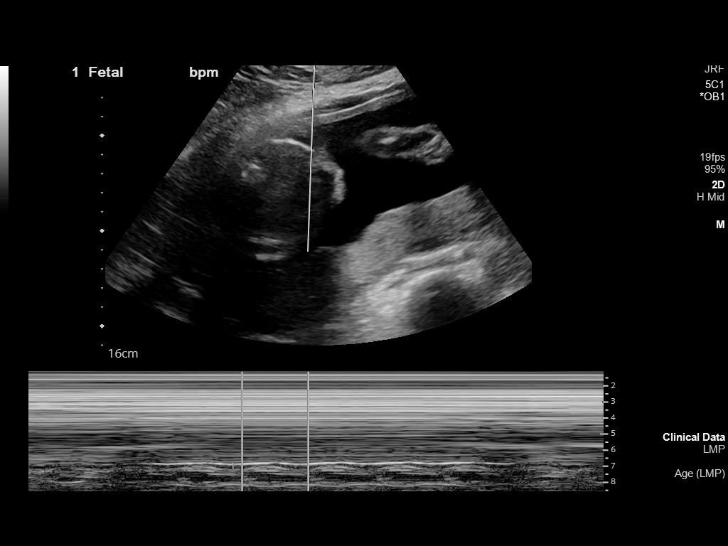
[im 15/21]
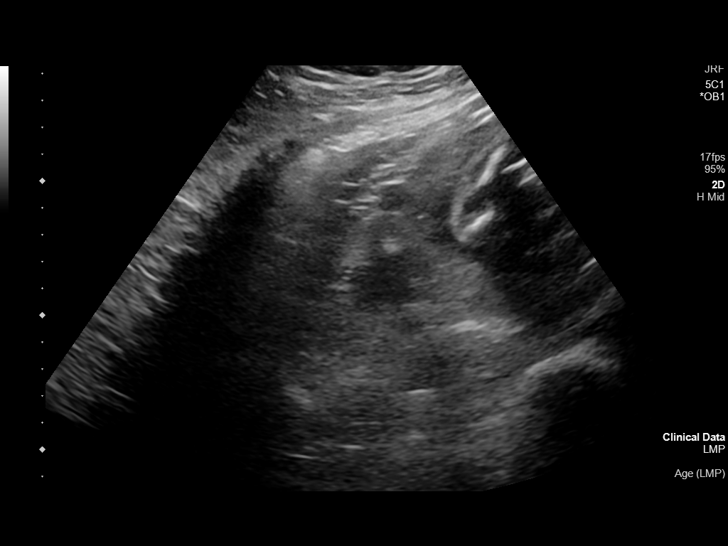
[im 16/21]
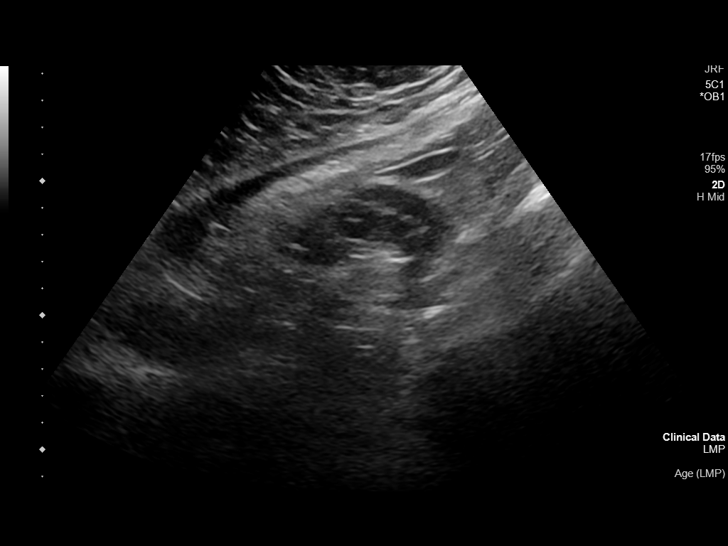
[im 18/21]
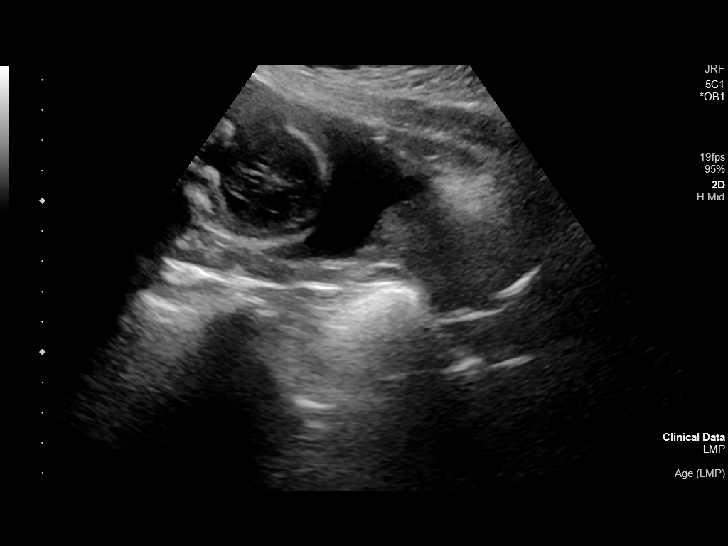
[im 19/21]
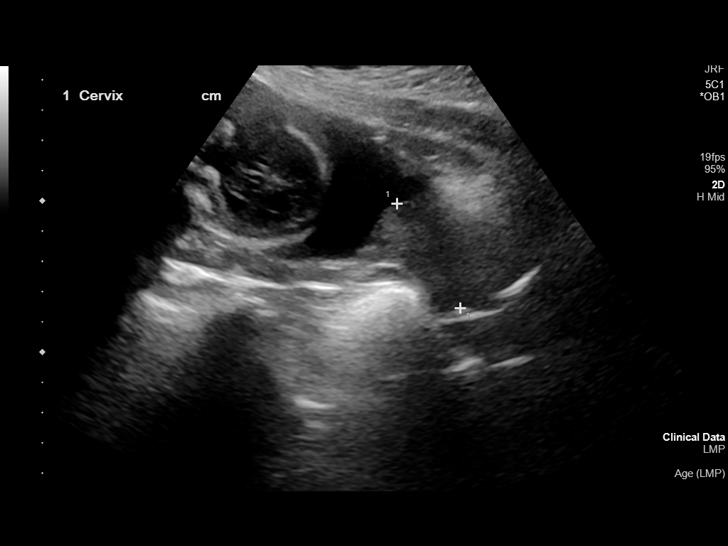
[im 21/21]
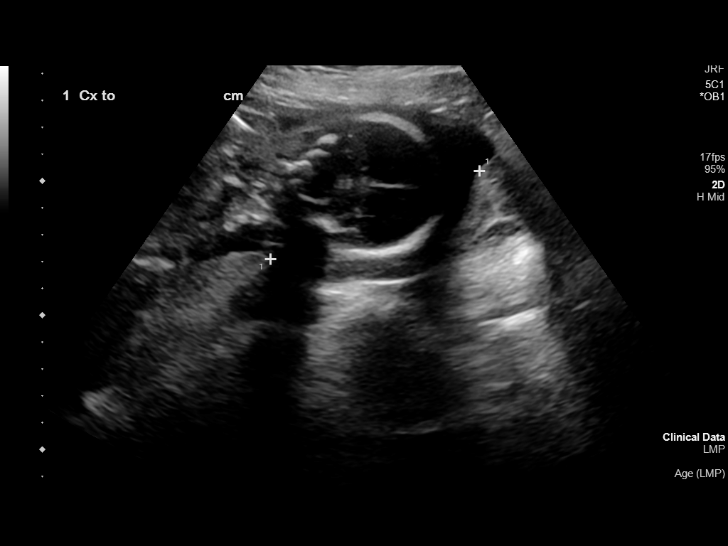

[14 of 21 positions shown; findings below may reference images not displayed]

FINDINGS: Number of Fetuses: 1

Heart Rate:  155 bpm

Movement: Yes

Presentation: Cephalic

Placental Location: Posterior

Previa: No

Amniotic Fluid (Subjective):  Within normal limits.

BPD: 5.82 cm 23 w  6 d

MATERNAL FINDINGS:

Cervix:  Appears closed.  Length measurement of 4 cm

Uterus/Adnexae: No abnormality visualized.
IMPRESSION: Single viable intrauterine pregnancy as above. Negative for previa
or abruption. Negative for retroplacental fluid collections.

This exam is performed on an emergent basis and does not
comprehensively evaluate fetal size, dating, or anatomy; follow-up
complete OB US should be considered if further fetal assessment is
warranted.

## 2022-08-09 IMAGING — DX DG ANKLE COMPLETE 3+V*L*
3 series · 3 of 3 positions shown · non-contrast
Comparison: None.

CLINICAL DATA: Rolled ankle

EXAM:
LEFT ANKLE COMPLETE - 3+ VIEW

[ankle ap]
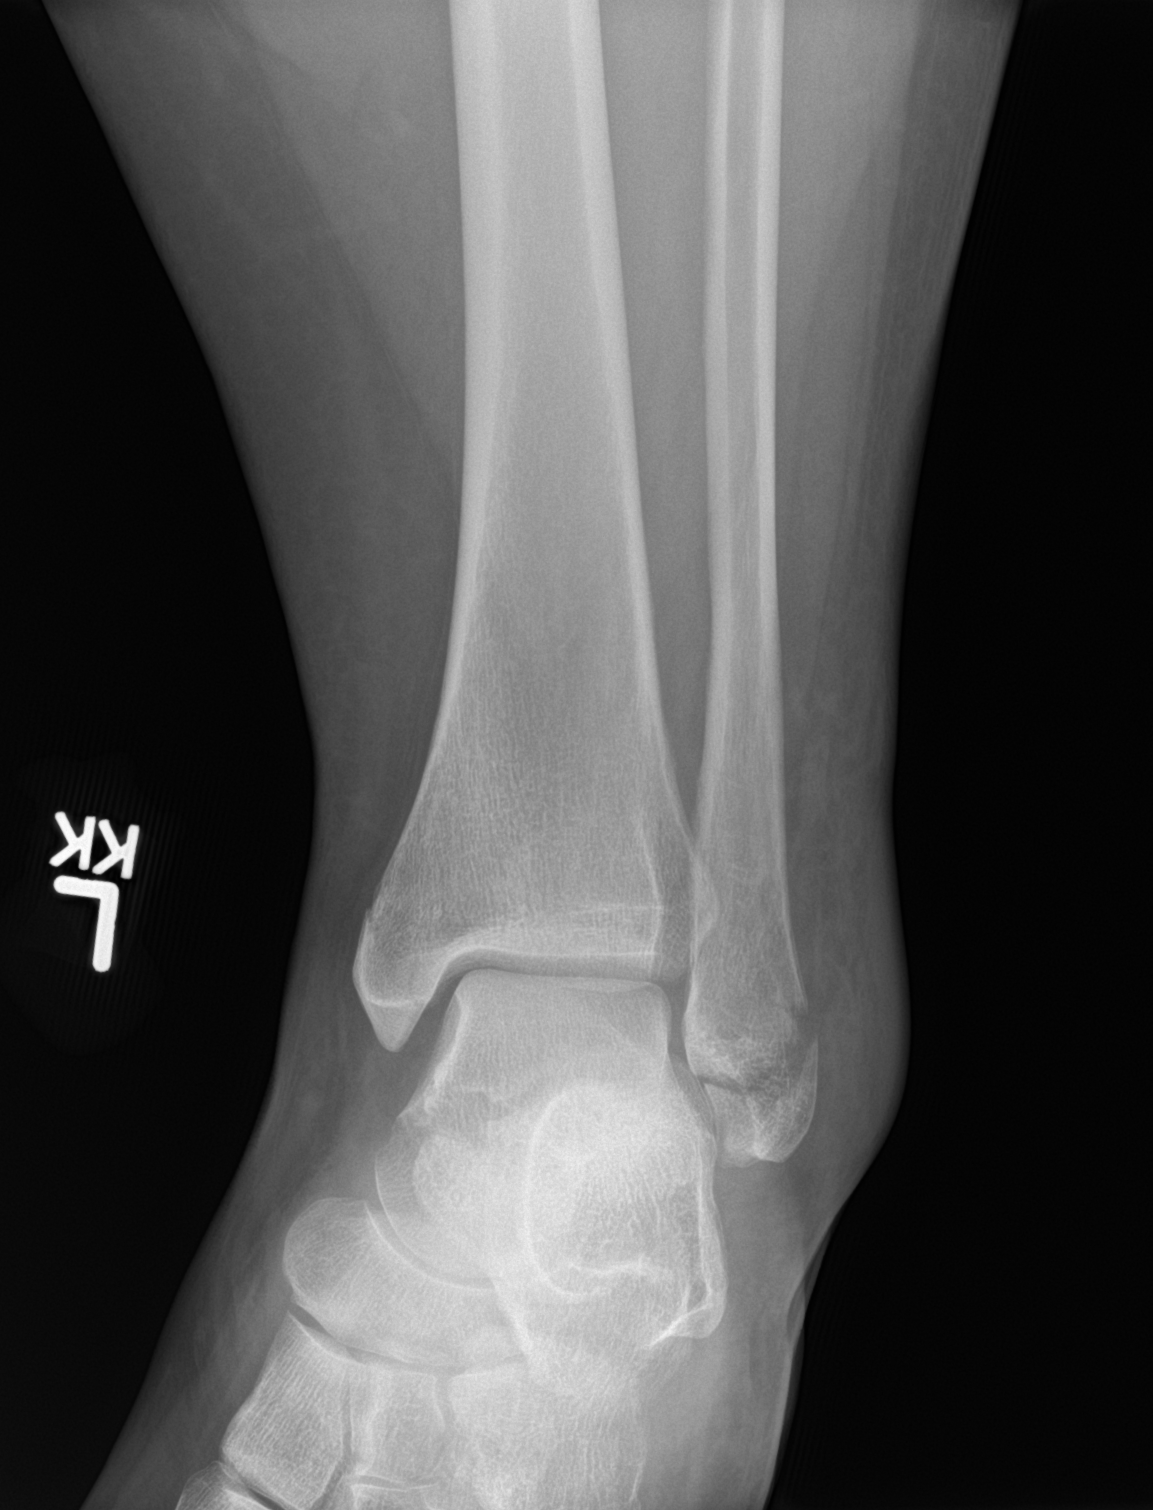

[ankle obl]
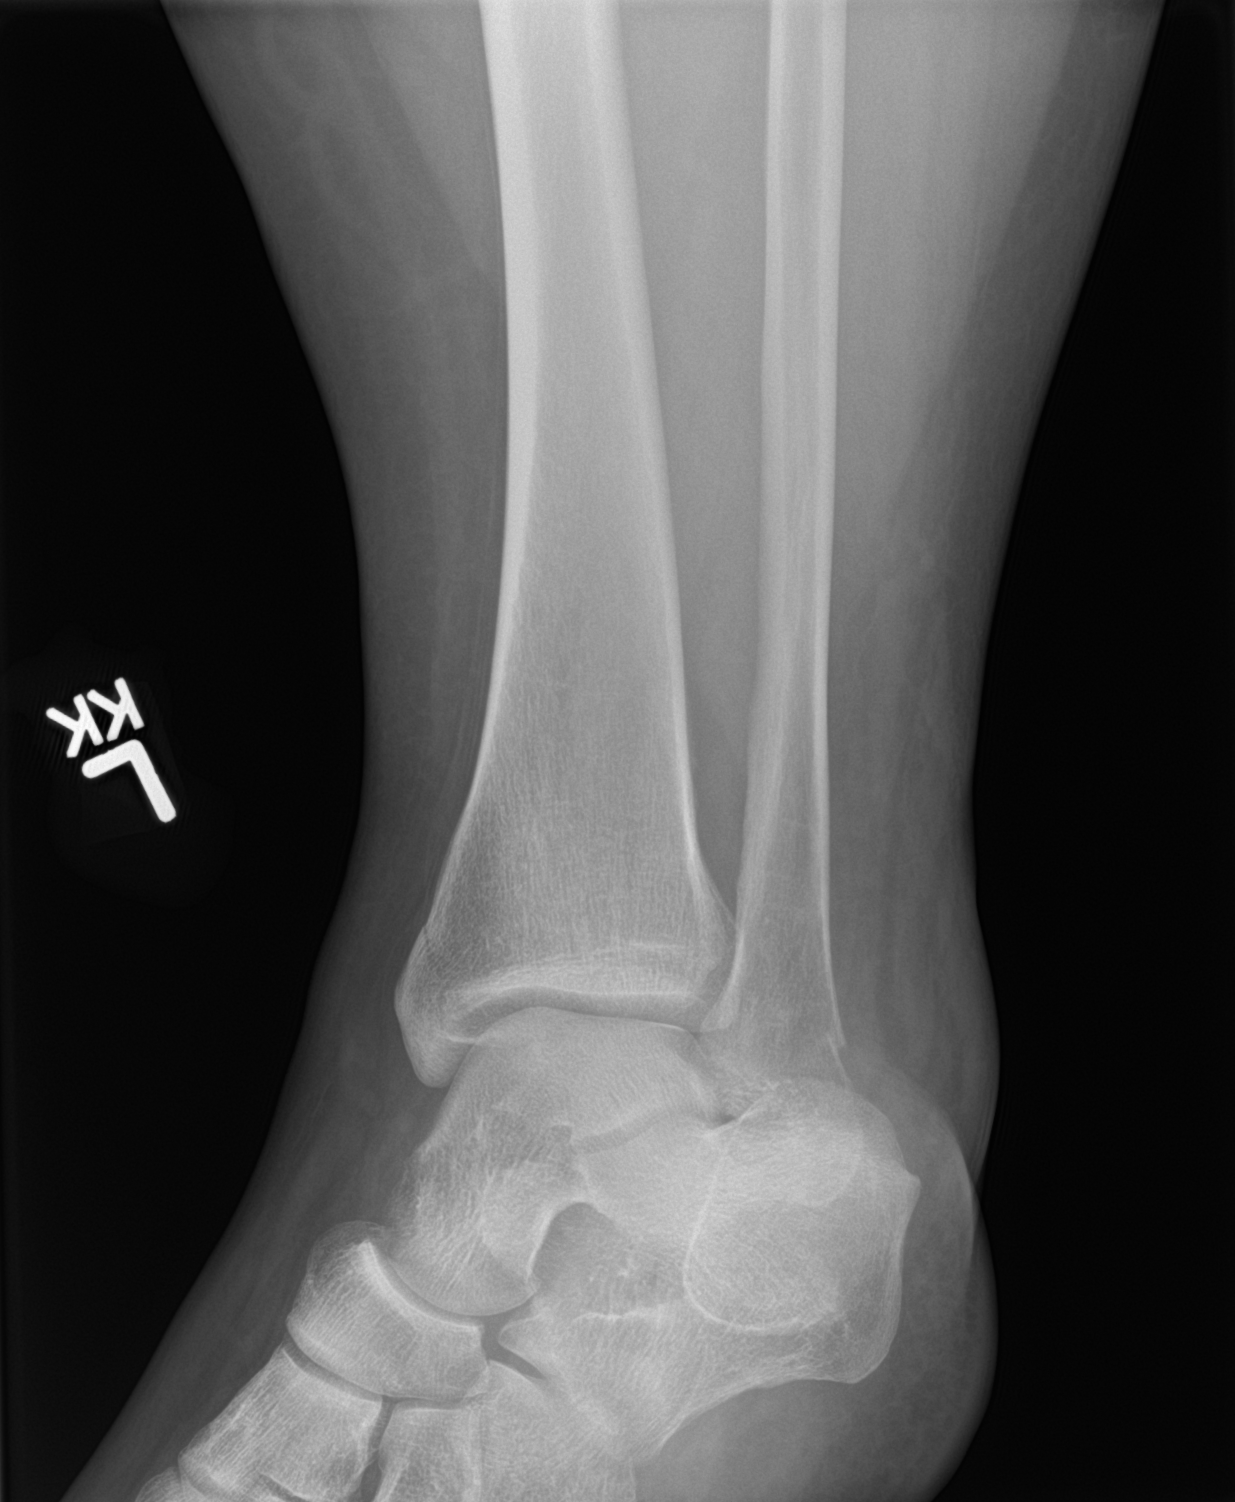

[ankle lat]
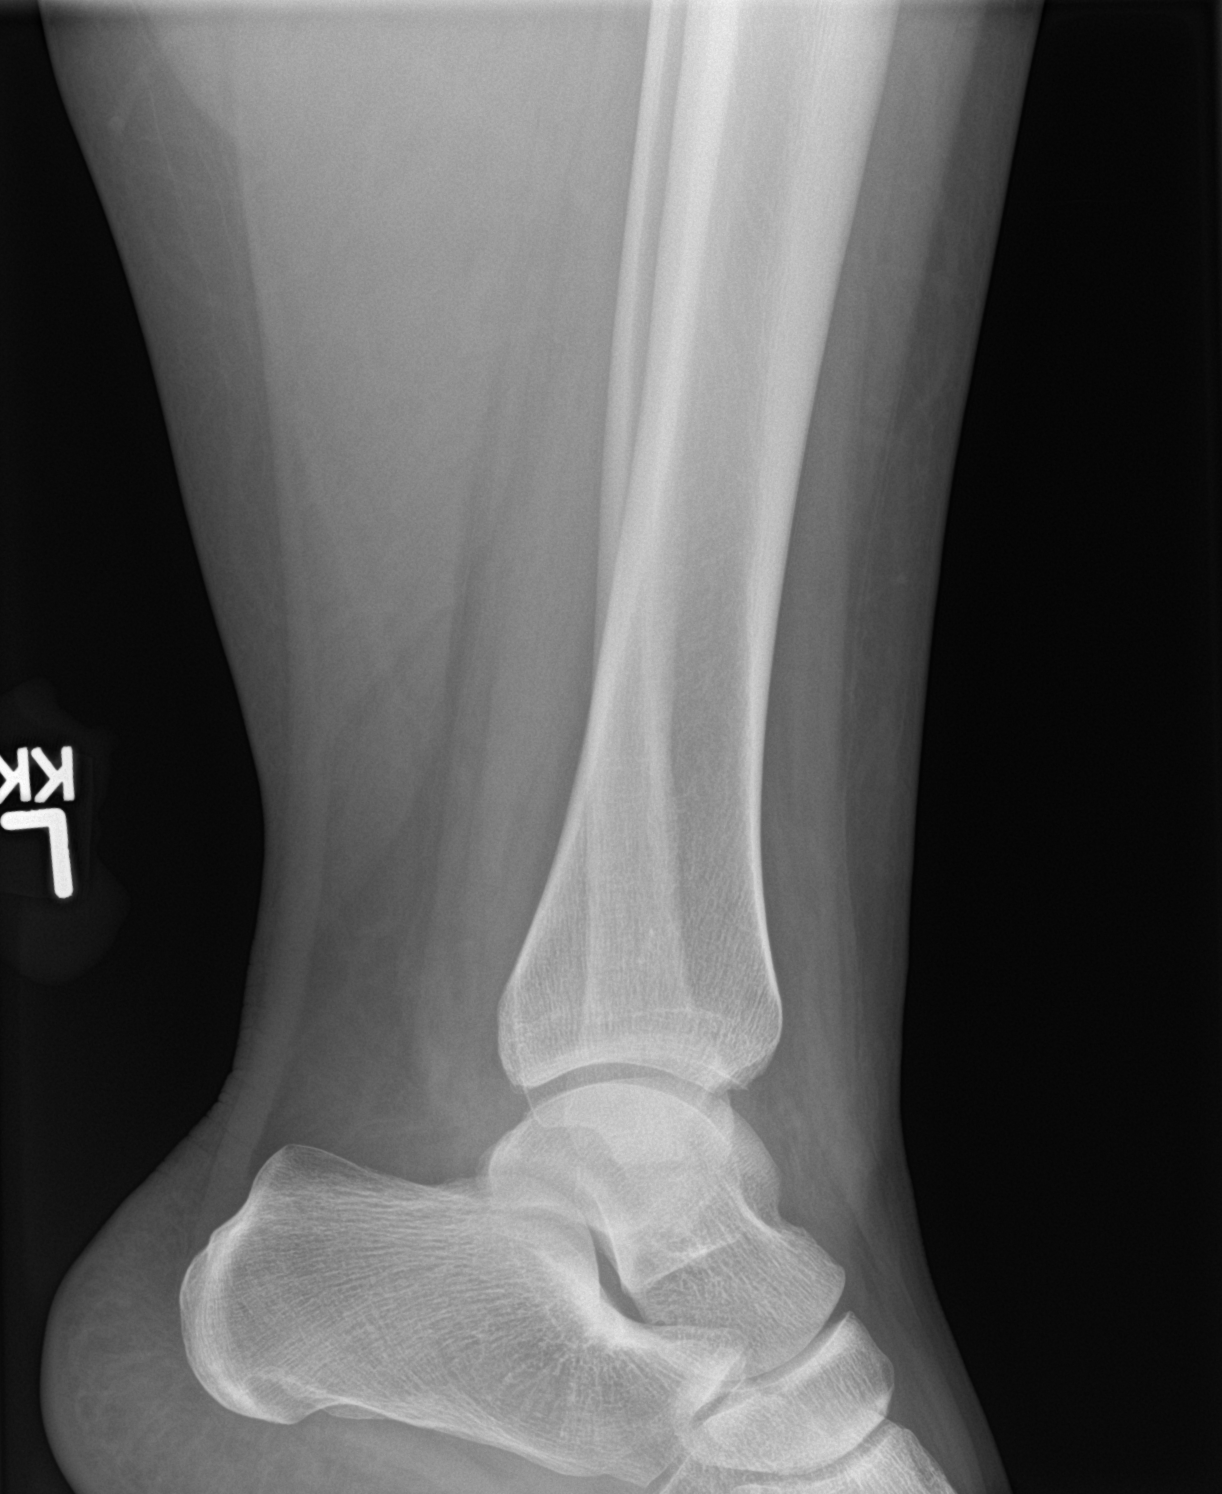

[3 of 3 positions shown; findings below may reference images not displayed]

FINDINGS: There is a minimally displaced fracture of the distal fibula. Ankle
mortise appears preserved. Lucency along the medial malleolus is
favored to reflect a nutrient foramen. Soft tissue edema overlying
the lateral malleolus. Joint effusion.
IMPRESSION: 1. Minimally displaced fracture of the distal fibula.
2. Lucency along the medial malleolus is favored to reflect a
nutrient foramen. Recommend correlation with point tenderness to
exclude nondisplaced fracture.
